# Patient Record
Sex: Female | Born: 1980
Health system: Southern US, Community
[De-identification: ages and names within clinical notes are randomized; demographics above are authoritative.]

## PROBLEM LIST (undated history)

## (undated) DIAGNOSIS — F32A Depression, unspecified: Secondary | ICD-10-CM

## (undated) DIAGNOSIS — I471 Supraventricular tachycardia, unspecified: Secondary | ICD-10-CM

## (undated) DIAGNOSIS — J189 Pneumonia, unspecified organism: Secondary | ICD-10-CM

## (undated) DIAGNOSIS — N83209 Unspecified ovarian cyst, unspecified side: Secondary | ICD-10-CM

## (undated) DIAGNOSIS — T83711A Erosion of implanted vaginal mesh and other prosthetic materials to surrounding organ or tissue, initial encounter: Secondary | ICD-10-CM

## (undated) DIAGNOSIS — E282 Polycystic ovarian syndrome: Secondary | ICD-10-CM

## (undated) DIAGNOSIS — Z87898 Personal history of other specified conditions: Secondary | ICD-10-CM

## (undated) DIAGNOSIS — E669 Obesity, unspecified: Secondary | ICD-10-CM

## (undated) DIAGNOSIS — F429 Obsessive-compulsive disorder, unspecified: Secondary | ICD-10-CM

## (undated) DIAGNOSIS — K589 Irritable bowel syndrome without diarrhea: Secondary | ICD-10-CM

## (undated) DIAGNOSIS — I10 Essential (primary) hypertension: Secondary | ICD-10-CM

## (undated) DIAGNOSIS — F319 Bipolar disorder, unspecified: Secondary | ICD-10-CM

## (undated) DIAGNOSIS — F329 Major depressive disorder, single episode, unspecified: Secondary | ICD-10-CM

## (undated) DIAGNOSIS — E785 Hyperlipidemia, unspecified: Secondary | ICD-10-CM

## (undated) HISTORY — DX: Personal history of other specified conditions: Z87.898

## (undated) HISTORY — PX: INCONTINENCE SURGERY: SHX676

## (undated) HISTORY — DX: Irritable bowel syndrome, unspecified: K58.9

## (undated) HISTORY — DX: Bipolar disorder, unspecified: F31.9

## (undated) HISTORY — DX: Supraventricular tachycardia: I47.1

## (undated) HISTORY — DX: Major depressive disorder, single episode, unspecified: F32.9

## (undated) HISTORY — DX: Essential (primary) hypertension: I10

## (undated) HISTORY — DX: Unspecified ovarian cyst, unspecified side: N83.209

## (undated) HISTORY — DX: Polycystic ovarian syndrome: E28.2

## (undated) HISTORY — DX: Pneumonia, unspecified organism: J18.9

## (undated) HISTORY — DX: Erosion of implanted vaginal mesh to surrounding organ or tissue, initial encounter: T83.711A

## (undated) HISTORY — DX: Obsessive-compulsive disorder, unspecified: F42.9

## (undated) HISTORY — DX: Supraventricular tachycardia, unspecified: I47.10

## (undated) HISTORY — PX: TONSILLECTOMY: SUR1361

## (undated) HISTORY — DX: Depression, unspecified: F32.A

## (undated) HISTORY — DX: Hyperlipidemia, unspecified: E78.5

## (undated) HISTORY — DX: Obesity, unspecified: E66.9

## (undated) HISTORY — PX: LAPAROSCOPY: SHX197

## (undated) HISTORY — PX: ADENOIDECTOMY: SUR15

## (undated) HISTORY — PX: ABDOMINAL HYSTERECTOMY: SHX81

---
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
## 2001-02-05 ENCOUNTER — Emergency Department (HOSPITAL_COMMUNITY): Admission: EM | Admit: 2001-02-05 | Discharge: 2001-02-05 | Payer: Self-pay | Admitting: Emergency Medicine

## 2001-02-05 ENCOUNTER — Encounter: Payer: Self-pay | Admitting: Emergency Medicine

## 2003-08-14 ENCOUNTER — Ambulatory Visit (HOSPITAL_COMMUNITY): Admission: RE | Admit: 2003-08-14 | Discharge: 2003-08-14 | Payer: Self-pay | Admitting: *Deleted

## 2003-08-18 ENCOUNTER — Inpatient Hospital Stay (HOSPITAL_COMMUNITY): Admission: AD | Admit: 2003-08-18 | Discharge: 2003-08-19 | Payer: Self-pay | Admitting: *Deleted

## 2003-08-23 ENCOUNTER — Inpatient Hospital Stay (HOSPITAL_COMMUNITY): Admission: AD | Admit: 2003-08-23 | Discharge: 2003-08-23 | Payer: Self-pay | Admitting: Obstetrics and Gynecology

## 2003-08-24 ENCOUNTER — Inpatient Hospital Stay (HOSPITAL_COMMUNITY): Admission: AD | Admit: 2003-08-24 | Discharge: 2003-08-24 | Payer: Self-pay | Admitting: Obstetrics & Gynecology

## 2003-08-31 ENCOUNTER — Inpatient Hospital Stay (HOSPITAL_COMMUNITY): Admission: AD | Admit: 2003-08-31 | Discharge: 2003-09-02 | Payer: Self-pay | Admitting: Obstetrics and Gynecology

## 2005-06-29 ENCOUNTER — Other Ambulatory Visit (HOSPITAL_COMMUNITY): Admission: RE | Admit: 2005-06-29 | Discharge: 2005-06-30 | Payer: Self-pay | Admitting: Psychiatry

## 2005-06-30 ENCOUNTER — Other Ambulatory Visit (HOSPITAL_COMMUNITY): Admission: RE | Admit: 2005-06-30 | Discharge: 2005-07-03 | Payer: Self-pay | Admitting: Psychiatry

## 2005-06-30 ENCOUNTER — Ambulatory Visit: Payer: Self-pay | Admitting: Psychiatry

## 2006-03-30 HISTORY — PX: OTHER SURGICAL HISTORY: SHX169

## 2007-12-30 ENCOUNTER — Emergency Department (HOSPITAL_BASED_OUTPATIENT_CLINIC_OR_DEPARTMENT_OTHER): Admission: EM | Admit: 2007-12-30 | Discharge: 2007-12-30 | Payer: Self-pay | Admitting: Emergency Medicine

## 2008-03-30 DIAGNOSIS — J189 Pneumonia, unspecified organism: Secondary | ICD-10-CM

## 2008-03-30 HISTORY — DX: Pneumonia, unspecified organism: J18.9

## 2009-03-30 DIAGNOSIS — N83209 Unspecified ovarian cyst, unspecified side: Secondary | ICD-10-CM

## 2009-03-30 HISTORY — DX: Unspecified ovarian cyst, unspecified side: N83.209

## 2010-08-15 NOTE — H&P (Signed)
Brenda Perry, CLINE                        ACCOUNT NO.:  [id number]   MEDICAL RECORD NO.:  [id number]                   PATIENT TYPE:   LOCATION:                                       FACILITY:   PHYSICIAN:   B. Earlene Plater, M.D.               DATE OF BIRTH:   DATE OF ADMISSION:  DATE OF DISCHARGE:                                HISTORY & PHYSICAL   DATE OF ADMISSION:  August 31, 2003   ADMISSION DIAGNOSES:  1. Thirty-eight-week intrauterine pregnancy.  2. Bipolar disorder with psychotic features.   She is admitted for induction of labor due to the fact that she is unable to  tolerate her psychiatric medications during the pregnancy and is in need of  restarting them.  She is having severe leg cramps and restless legs and  discontinued medication about 1 week ago due to this.  The patient is  willing to restart after pregnancy is ended.  She also has advanced cervical  dilatation with the cervix being dilated 3-4 cm and 70% effaced with group B  strep status.  For these reasons the patient is admitted for induction of  labor.  We did discuss a low likelihood of any significant respiratory  distress, given her proximity to 39 weeks.   PAST MEDICAL HISTORY:  1. Bipolar disorder with psychotic features as outlined above; history of     previous suicide attempt.  2. IBS.  3. Anemia.   FAMILY HISTORY:  Hypertension, diabetes.   PAST SURGICAL HISTORY:  Tonsils.   SOCIAL HISTORY:  The patient smoked prior to pregnancy but stopped when she  learned she was pregnant.  No alcohol, tobacco or other drugs.   MEDICATIONS:  Prenatal vitamins.   ALLERGIES:  LATEX.   PRENATAL LABORATORY DATA:  Group B strep is positive.  O positive.  Rubella  immune.  Hepatitis B, HIV all negative.   PHYSICAL EXAMINATION:  VITAL SIGNS:  Blood pressure 110/70, weight 198.  GENERAL:  Alert and oriented, no acute distress.  SKIN:  Warm and dry, no lesions.  HEART:  Regular rate and rhythm.  LUNGS:   Clear to auscultation.  ABDOMEN:  Fundal height is appropriate.  Fetal heart tones in the 130s.  PELVIC:  Cervix 3-4 cm dilated, 70% effaced, 0 station, and vertex.   ASSESSMENT:  1. Thirty-eight-week intrauterine pregnancy.  2. History of severe depression with bipolar disorder, psychotic features     and previous suicide attempt, not tolerating psychiatric medicines while     pregnancy.  3. Group B streptococcus positive.   PLAN:  1. Admission for induction of labor.  2. Penicillin prophylaxis for group B strep.  Gerri Spore B. Earlene Plater, M.D.    WBD/MEDQ  D:  08/30/2003  T:  08/30/2003  Job:  [id number]

## 2011-08-12 ENCOUNTER — Encounter: Payer: Self-pay | Admitting: Obstetrics & Gynecology

## 2011-08-12 ENCOUNTER — Other Ambulatory Visit (HOSPITAL_COMMUNITY)
Admission: RE | Admit: 2011-08-12 | Discharge: 2011-08-12 | Disposition: A | Discharge status: Home / Self Care | Payer: Medicare PPO | Source: Ambulatory Visit | Attending: Obstetrics & Gynecology | Admitting: Obstetrics & Gynecology

## 2011-08-12 ENCOUNTER — Ambulatory Visit (INDEPENDENT_AMBULATORY_CARE_PROVIDER_SITE_OTHER): Payer: Medicare PPO | Admitting: Obstetrics & Gynecology

## 2011-08-12 ENCOUNTER — Other Ambulatory Visit: Payer: Self-pay | Admitting: Obstetrics & Gynecology

## 2011-08-12 VITALS — BP 119/83 | HR 87 | Temp 98.5°F | Resp 17 | Ht 66.0 in | Wt 223.0 lb

## 2011-08-12 DIAGNOSIS — N898 Other specified noninflammatory disorders of vagina: Secondary | ICD-10-CM

## 2011-08-12 DIAGNOSIS — R87619 Unspecified abnormal cytological findings in specimens from cervix uteri: Secondary | ICD-10-CM | POA: Insufficient documentation

## 2011-08-12 DIAGNOSIS — Z124 Encounter for screening for malignant neoplasm of cervix: Secondary | ICD-10-CM | POA: Insufficient documentation

## 2011-08-12 DIAGNOSIS — IMO0002 Reserved for concepts with insufficient information to code with codable children: Secondary | ICD-10-CM | POA: Insufficient documentation

## 2011-08-12 DIAGNOSIS — Z0000 Encounter for general adult medical examination without abnormal findings: Secondary | ICD-10-CM

## 2011-08-12 DIAGNOSIS — R32 Unspecified urinary incontinence: Secondary | ICD-10-CM

## 2011-08-12 NOTE — Progress Notes (Signed)
  Subjective:     Brenda Perry is a 31 y.o. female here for a routine exam.  Current complaints: pain with sex, splinting during bowel movements, urinary incontinence.  Personal health questionnaire reviewed: yes.   Gynecologic History Patient's last menstrual period was 09/12/2006. Contraception: status post hysterectomy Last Pap: 2011. Results were: normal per pt Last mammogram: 5 months ago. Results were: abnml--getting follow up  Obstetric History OB History    Grav Para Term Preterm Abortions TAB SAB Ect Mult Living   2 2 2       2      # Outc Date GA Lbr Len/2nd Wgt Sex Del Anes PTL Lv   1 TRM      SVD      2 TRM      SVD          The following portions of the patient's history were reviewed and updated as appropriate: allergies, current medications, past family history, past medical history, past social history, past surgical history and problem list.  Review of Systems A comprehensive review of systems was negative except for: Gastrointestinal: positive for loose stools with metformin Genitourinary: positive for frequency, urinary incontinence and pain with intercourse, mesh "cut" partners penis, splinting with bowel movements, urinary urge incontince, feeling of dribbling, vaginal discharge and odor    Objective:    Vitals:  WNL General appearance: alert, cooperative and no distress Head: Normocephalic, without obvious abnormality, atraumatic Eyes: negative Throat: lips, mucosa, and tongue normal; teeth and gums normal Lungs: clear to auscultation bilaterally Breasts: normal appearance, no masses or tenderness, No nipple retraction or dimpling, No nipple discharge or bleeding Heart: regular rate and rhythm Abdomen: soft, non-tender; bowel sounds normal; no masses,  no organomegaly Pelvic: uterus surgically absent, no adnexal masses, pain with palpation of urethra, area under left side of symphysis is is thinner and painful, did not feel know or mesh, decreased  sphincter tone, stool in vault, nml vaginal discharge, descent of anterior vaginal wall with valsalva, mild rectocele near rectal sphincter. Extremities: no edema, redness or tenderness in the calves or thighs Skin: no lesions or rash Lymph nodes: Axillary adenopathy: none       Assessment:    Healthy female exam.  Urinary / pelvic prolapse issues   Plan:    Needs appt with urologys--MacDirmid F/U with primary care regarding mammogram Pap today (needs yearly until age 69) Wet prep and cultures

## 2011-08-13 LAB — GC/CHLAMYDIA PROBE AMP, GENITAL
Chlamydia, DNA Probe: NEGATIVE
GC Probe Amp, Genital: NEGATIVE

## 2011-08-17 ENCOUNTER — Other Ambulatory Visit: Payer: Self-pay | Admitting: Obstetrics & Gynecology

## 2011-08-17 ENCOUNTER — Telehealth: Payer: Self-pay | Admitting: *Deleted

## 2011-08-17 MED ORDER — METRONIDAZOLE 500 MG PO TABS: 500.0000 mg | ORAL_TABLET | Freq: Two times a day (BID) | ORAL | Status: AC

## 2011-08-17 NOTE — Telephone Encounter (Signed)
LM on cell phone that she does have BV and that a RX for Flagyl was sent to [facility].  Instructed pt to call if she had any questions.

## 2012-01-11 ENCOUNTER — Ambulatory Visit (HOSPITAL_COMMUNITY): Payer: Self-pay | Admitting: Psychiatry

## 2012-02-09 ENCOUNTER — Ambulatory Visit (HOSPITAL_COMMUNITY): Payer: Self-pay | Admitting: Psychiatry

## 2012-02-12 ENCOUNTER — Ambulatory Visit (HOSPITAL_COMMUNITY): Payer: Self-pay | Admitting: Psychiatry

## 2012-02-19 ENCOUNTER — Ambulatory Visit (HOSPITAL_COMMUNITY): Payer: Self-pay | Admitting: Psychiatry

## 2012-03-03 ENCOUNTER — Encounter (HOSPITAL_COMMUNITY): Payer: Self-pay | Admitting: Psychiatry

## 2012-03-03 ENCOUNTER — Ambulatory Visit (INDEPENDENT_AMBULATORY_CARE_PROVIDER_SITE_OTHER): Payer: 59 | Admitting: Psychiatry

## 2012-03-03 VITALS — BP 125/82 | HR 85 | Ht 65.25 in | Wt 224.0 lb

## 2012-03-03 DIAGNOSIS — F250 Schizoaffective disorder, bipolar type: Secondary | ICD-10-CM

## 2012-03-03 DIAGNOSIS — F259 Schizoaffective disorder, unspecified: Secondary | ICD-10-CM

## 2012-03-03 MED ORDER — ARIPIPRAZOLE 15 MG PO TABS: 7.5000 mg | ORAL_TABLET | Freq: Two times a day (BID) | ORAL | Status: DC
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
## 2012-03-08 ENCOUNTER — Telehealth (HOSPITAL_COMMUNITY): Payer: Self-pay

## 2012-03-08 ENCOUNTER — Encounter (HOSPITAL_COMMUNITY): Payer: Self-pay | Admitting: Psychiatry

## 2012-03-09 NOTE — Telephone Encounter (Signed)
Called patient, no answer. Called patient's pharmacy. The patient last filled Abilify on 01/27/2012. She did not fill prescription from this provider on 03/02/2012. She states she will be going back to Dr. Caralee Ates for care.   PLAN: As patient has not filled Abilify and according to pharmacy would not have had Abilify since 02/26/2012, assuming she was taking this on a regular basis. Will discontinue prescription and send letter regarding discontinuation of services.

## 2012-03-10 ENCOUNTER — Ambulatory Visit (HOSPITAL_COMMUNITY): Payer: Self-pay | Admitting: Psychiatry

## 2012-03-17 ENCOUNTER — Ambulatory Visit (HOSPITAL_COMMUNITY): Payer: Self-pay | Admitting: Psychiatry

## 2012-04-26 ENCOUNTER — Encounter: Payer: Self-pay | Admitting: Cardiology

## 2012-04-26 ENCOUNTER — Encounter: Payer: Self-pay | Admitting: *Deleted

## 2012-04-27 ENCOUNTER — Ambulatory Visit (INDEPENDENT_AMBULATORY_CARE_PROVIDER_SITE_OTHER): Payer: 59 | Admitting: Cardiology

## 2012-04-27 ENCOUNTER — Encounter: Payer: Self-pay | Admitting: Cardiology

## 2012-04-27 ENCOUNTER — Ambulatory Visit: Payer: Self-pay | Admitting: Cardiology

## 2012-04-27 VITALS — BP 120/70 | HR 81 | Wt 225.0 lb

## 2012-04-27 DIAGNOSIS — Z720 Tobacco use: Secondary | ICD-10-CM

## 2012-04-27 DIAGNOSIS — F17200 Nicotine dependence, unspecified, uncomplicated: Secondary | ICD-10-CM

## 2012-04-27 DIAGNOSIS — R002 Palpitations: Secondary | ICD-10-CM

## 2012-04-27 NOTE — Assessment & Plan Note (Signed)
Etiology unclear. She had an echocardiogram and monitor in the fall. I will ask for those records to be forwarded to [place] for review. We will make further recommendations once we have those results. Continue Toprol. Recent TSH and potassium normal.

## 2012-04-27 NOTE — Progress Notes (Signed)
HPI: 32 year old female for evaluation of palpitations. Note she has had a previous negative evaluation for pheochromocytoma including imaging. Thyroid functions in December 2013 normal. Potassium 4.9. Patient states she has had palpitations for approximately one year. She describes these as sudden in onset with her heart racing and then gradually slowing down. They last anywhere from 5-30 minutes.  She notices this both with exertion and at rest. She has some associated dizziness and occasional chest pain with these episodes. She has not had syncope. She also has dyspnea with the episodes. She denies exertional chest pain, dyspnea on exertion, orthopnea, PND. She was seen by a cardiologist in [place] in the fall. She had an echocardiogram that she was told was normal and a monitor that showed sinus tachycardia. I do not have those results available. Because of her palpitations we were asked to evaluate.  Current Outpatient Prescriptions  Medication Sig Dispense Refill  . ARIPiprazole (ABILIFY) 15 MG tablet Take 0.5 tablets (7.5 mg total) by mouth 2 (two) times daily.  30 tablet  1  . LORazepam (ATIVAN) 1 MG tablet Take 1 mg by mouth every 8 (eight) hours as needed.       . metFORMIN (GLUCOPHAGE-XR) 500 MG 24 hr tablet Take 500 mg by mouth 2 (two) times daily.       . metoprolol succinate (TOPROL-XL) 25 MG 24 hr tablet Take 25 mg by mouth 2 (two) times daily.         Allergies  Allergen Reactions  . Latex Rash    Past Medical History  Diagnosis Date  . IBS (irritable bowel syndrome)   . Bipolar 1 disorder   . OCD (obsessive compulsive disorder)   . Depression   . Pneumonia 2010  . Hyperlipidemia   . Hypertension   . PCOS (polycystic ovarian syndrome)   . Endometriosis 2011  . Ovarian cyst 2011  . H/O abnormal Pap smear     cryo    Past Surgical History  Procedure Date  . Laparoscopy 1996  2011  . Lavh 2008  . Vaginal/rectal repair 2008  . Tonsillectomy and adenoidectomy  1996  . Abdominal hysterectomy     History   Social History  . Marital Status: Married    Spouse Name: N/A    Number of Children: 3  . Years of Education: N/A   Occupational History  . Not on file.   Social History Main Topics  . Smoking status: Current Every Day Smoker -- 1.0 packs/day for 15 years    Types: Cigarettes  . Smokeless tobacco: Never Used  . Alcohol Use: Yes     Comment: jan 2013  . Drug Use: No     Comment: Past use 22.  Marland Kitchen Sexually Active: Yes -- Female partner(s)    Birth Control/ Protection: Other-see comments     Comment: hysterectomy   Other Topics Concern  . Not on file   Social History Narrative  . No narrative on file    Family History  Problem Relation Age of Onset  . Cancer Father     femure  . Hypertension Father   . Hyperlipidemia Father   . Diabetes Father   . Vasculitis Father     Cerbral Vasculitis  . Cancer Mother     uterine  . Hyperlipidemia Mother   . Fibromyalgia Mother   . Cancer Paternal Aunt     vaginal  . Endometriosis Cousin   . Lupus Cousin   . Heart disease Paternal Grandmother   .  Diabetes Paternal Grandmother   . Thyroid disease Paternal Grandmother   . Heart disease Paternal Aunt   . Heart disease Maternal Grandfather     A-Fib  . Hyperlipidemia Maternal Grandfather   . Hyperlipidemia Maternal Grandmother   . Coronary artery disease Paternal Grandfather   . Aneurysm Paternal Uncle     ROS: fatigue but no fevers or chills, productive cough, hemoptysis, dysphasia, odynophagia, melena, hematochezia, dysuria, hematuria, rash, seizure activity, orthopnea, PND, pedal edema, claudication. Remaining systems are negative.  Physical Exam:   Blood pressure 120/70, pulse 81, weight 225 lb (102.059 kg), last menstrual period 09/12/2006.  General:  Well developed/obese in NAD Skin warm/dry Patient not depressed No peripheral clubbing Back-normal HEENT-normal/normal eyelids Neck supple/normal carotid upstroke  bilaterally; no bruits; no JVD; no thyromegaly chest - CTA/ normal expansion CV - RRR/normal S1 and S2; no murmurs, rubs or gallops;  PMI nondisplaced Abdomen -NT/ND, no HSM, no mass, + bowel sounds, no bruit 2+ femoral pulses, no bruits Ext-no edema, chords, 2+ DP Neuro-grossly nonfocal  ECG sinus rhythm at a rate of 81. No ST changes.

## 2012-04-27 NOTE — Assessment & Plan Note (Signed)
Patient counseled on discontinuing. 

## 2012-04-27 NOTE — Patient Instructions (Addendum)
Your physician recommends that you schedule a follow-up appointment in: 8 WEEKS WITH DR Jens Som IN [place]

## 2012-04-28 ENCOUNTER — Telehealth: Payer: Self-pay | Admitting: Cardiology

## 2012-04-28 NOTE — Telephone Encounter (Signed)
ROI faxed to [facility] @ [phone] 04/28/12/KM

## 2012-06-22 ENCOUNTER — Encounter: Payer: Self-pay | Admitting: Cardiology

## 2012-06-22 NOTE — Progress Notes (Signed)
   HPI: 32 year old female for fu of palpitations. Note she has had a previous negative evaluation for pheochromocytoma including imaging. Thyroid functions in December 2013 normal. Potassium 4.9. Patient states she has had palpitations for approximately one year. She was seen by a cardiologist in [place] in the fall. She had an echocardiogram that she was told was normal and a monitor that showed sinus tachycardia. I do not have those results available. Since I last saw her,    Current Outpatient Prescriptions  Medication Sig Dispense Refill  . ARIPiprazole (ABILIFY) 15 MG tablet Take 0.5 tablets (7.5 mg total) by mouth 2 (two) times daily.  30 tablet  1  . LORazepam (ATIVAN) 1 MG tablet Take 1 mg by mouth every 8 (eight) hours as needed.       . metFORMIN (GLUCOPHAGE-XR) 500 MG 24 hr tablet Take 500 mg by mouth 2 (two) times daily.       . metoprolol succinate (TOPROL-XL) 25 MG 24 hr tablet Take 25 mg by mouth 2 (two) times daily.        No current facility-administered medications for this visit.     Past Medical History  Diagnosis Date  . IBS (irritable bowel syndrome)   . Bipolar 1 disorder   . OCD (obsessive compulsive disorder)   . Depression   . Pneumonia 2010  . Hyperlipidemia   . Hypertension   . PCOS (polycystic ovarian syndrome)   . Endometriosis 2011  . Ovarian cyst 2011  . H/O abnormal Pap smear     cryo    Past Surgical History  Procedure Laterality Date  . Laparoscopy  1996  2011  . Lavh  2008  . Vaginal/rectal repair  2008  . Tonsillectomy and adenoidectomy  1996  . Abdominal hysterectomy      History   Social History  . Marital Status: Married    Spouse Name: N/A    Number of Children: 3  . Years of Education: N/A   Occupational History  . Not on file.   Social History Main Topics  . Smoking status: Current Every Day Smoker -- 1.00 packs/day for 15 years    Types: Cigarettes  . Smokeless tobacco: Never Used  . Alcohol Use: Yes   Comment: jan 2013  . Drug Use: No     Comment: Past use 22.  Marland Kitchen Sexually Active: Yes -- Female partner(s)    Birth Control/ Protection: Other-see comments     Comment: hysterectomy   Other Topics Concern  . Not on file   Social History Narrative  . No narrative on file    ROS: no fevers or chills, productive cough, hemoptysis, dysphasia, odynophagia, melena, hematochezia, dysuria, hematuria, rash, seizure activity, orthopnea, PND, pedal edema, claudication. Remaining systems are negative.  Physical Exam: Well-developed well-nourished in no acute distress.  Skin is warm and dry.  HEENT is normal.  Neck is supple.  Chest is clear to auscultation with normal expansion.  Cardiovascular exam is regular rate and rhythm.  Abdominal exam nontender or distended. No masses palpated. Extremities show no edema. neuro grossly intact  ECG     This encounter was created in error - please disregard.

## 2012-07-04 ENCOUNTER — Encounter: Payer: Self-pay | Admitting: Cardiology

## 2012-07-06 ENCOUNTER — Telehealth: Payer: Self-pay | Admitting: Cardiology

## 2012-07-06 NOTE — Telephone Encounter (Signed)
New Problem:    Patient called in concerned because she is experiencing rapid heart rate, SOB and extreme fatigue with minimal exertion.  Please call back.

## 2012-07-06 NOTE — Telephone Encounter (Signed)
Pt C/O of heart rasing up even when sitting talking.heart beats goes up to  110 to 130 beats per minute, pt  has SOB, fatigue with it. Pt would like to know if she needs to be seen soon. An appointment was made for pt in this office tomorrow 07/07/12 at 9:00 AM, pt aware.

## 2012-07-07 ENCOUNTER — Encounter: Payer: Self-pay | Admitting: Cardiology

## 2012-07-07 ENCOUNTER — Ambulatory Visit (INDEPENDENT_AMBULATORY_CARE_PROVIDER_SITE_OTHER): Payer: 59 | Admitting: Cardiology

## 2012-07-07 VITALS — BP 140/90 | HR 86 | Ht 66.0 in | Wt 233.8 lb

## 2012-07-07 DIAGNOSIS — R079 Chest pain, unspecified: Secondary | ICD-10-CM

## 2012-07-07 DIAGNOSIS — R232 Flushing: Secondary | ICD-10-CM | POA: Insufficient documentation

## 2012-07-07 DIAGNOSIS — I10 Essential (primary) hypertension: Secondary | ICD-10-CM

## 2012-07-07 DIAGNOSIS — R002 Palpitations: Secondary | ICD-10-CM

## 2012-07-07 MED ORDER — METOPROLOL SUCCINATE ER 25 MG PO TB24: ORAL_TABLET | ORAL | Status: DC

## 2012-07-07 NOTE — Assessment & Plan Note (Signed)
Schedule stress-echocardiogram.

## 2012-07-07 NOTE — Progress Notes (Signed)
HPI: 32 year old female I initially saw in Jan 2014 for evaluation of palpitations. Note she has had a previous negative evaluation for pheochromocytoma including imaging. Thyroid functions in December 2013 normal. Potassium 4.9. She was seen by a cardiologist in [place] in the fall. She had an echocardiogram that she was told was normal and a monitor that showed sinus tachycardia. I do not have those results available. Since I last saw her, she describes fatigue which is unchanged. She has dyspnea on exertion. She describes chest pain both at rest and with exertion. She describes heart pounding.   Current Outpatient Prescriptions  Medication Sig Dispense Refill  . ARIPiprazole (ABILIFY) 5 MG tablet Take 5 mg by mouth 2 (two) times daily.      Marland Kitchen LORazepam (ATIVAN) 1 MG tablet Take 1 mg by mouth every 8 (eight) hours as needed.       . metFORMIN (GLUCOPHAGE-XR) 500 MG 24 hr tablet Take 500 mg by mouth 2 (two) times daily.       . metoprolol succinate (TOPROL-XL) 25 MG 24 hr tablet Take 25 mg by mouth 2 (two) times daily.        No current facility-administered medications for this visit.     Past Medical History  Diagnosis Date  . Bipolar disorder   . Polycystic ovary syndrome   . HTN (hypertension)   . Obesity   . Erosion of vaginal mesh   . PSVT (paroxysmal supraventricular tachycardia)   . IBS (irritable bowel syndrome)   . Bipolar 1 disorder   . OCD (obsessive compulsive disorder)   . Depression   . Pneumonia 2010  . Hyperlipidemia   . Hypertension   . PCOS (polycystic ovarian syndrome)   . Endometriosis 2011  . Ovarian cyst 2011  . H/O abnormal Pap smear     cryo    Past Surgical History  Procedure Laterality Date  . Adenoidectomy    . Tonsillectomy      `  . Incontinence surgery    . Breast tissure bx-fibrosis    . Laparoscopy  1996  2011  . Lavh  2008  . Vaginal/rectal repair  2008  . Tonsillectomy and adenoidectomy  1996  . Abdominal hysterectomy       History   Social History  . Marital Status: Married    Spouse Name: N/A    Number of Children: 3  . Years of Education: N/A   Occupational History  . Not on file.   Social History Main Topics  . Smoking status: Current Every Day Smoker -- 1.00 packs/day for 15 years    Types: Cigarettes  . Smokeless tobacco: Never Used  . Alcohol Use: Yes     Comment: jan 2013  . Drug Use: No     Comment: Past use 22.  Marland Kitchen Sexually Active: Yes -- Female partner(s)    Birth Control/ Protection: Other-see comments     Comment: hysterectomy   Other Topics Concern  . Not on file   Social History Narrative   ** Merged History Encounter **        ROS: flushing but no fevers or chills, productive cough, hemoptysis, dysphasia, odynophagia, melena, hematochezia, dysuria, hematuria, rash, seizure activity, orthopnea, PND, pedal edema, claudication. Remaining systems are negative.  Physical Exam: Well-developed obese in no acute distress.  Skin is warm and dry.  HEENT is normal.  Neck is supple.  Chest is clear to auscultation with normal expansion.  Cardiovascular exam is regular rate and rhythm.  Abdominal exam nontender or distended. No masses palpated. Extremities show no edema. neuro grossly intact  ECG sinus rhythm at a rate of 86. No ST changes.

## 2012-07-07 NOTE — Patient Instructions (Addendum)
Your physician recommends that you schedule a follow-up appointment in: 6-8 WEEKS WITH DR Jens Som IN [facility]  Your physician has requested that you have a stress echocardiogram. For further information please visit [email or web address] Please follow instruction sheet as given.   24 HOUR URINE  Your physician has recommended that you wear an event monitor. Event monitors are medical devices that record the heart's electrical activity. Doctors most often [place] these monitors to diagnose arrhythmias. Arrhythmias are problems with the speed or rhythm of the heartbeat. The monitor is a small, portable device. You can wear one while you do your normal daily activities. This is usually used to diagnose what is causing palpitations/syncope (passing out).   INCREASE METOPROLOL TO 25 MG IN THE MORNING AND 50 MG IN THE EVENING

## 2012-07-07 NOTE — Assessment & Plan Note (Signed)
We'll arrange urine for catecholamines and metanephrines. Check 5 hydroxy indoleacetic acid. I think carcinoid is unlikely but should be excluded.

## 2012-07-07 NOTE — Assessment & Plan Note (Signed)
Plan repeatCardioNet. Increase Toprol to 25 mg in the morning and 50 mg in the evening.

## 2012-07-08 ENCOUNTER — Telehealth: Payer: Self-pay | Admitting: *Deleted

## 2012-07-08 ENCOUNTER — Other Ambulatory Visit (HOSPITAL_COMMUNITY): Payer: Self-pay | Admitting: Cardiology

## 2012-07-08 DIAGNOSIS — R072 Precordial pain: Secondary | ICD-10-CM

## 2012-07-08 NOTE — Telephone Encounter (Signed)
Left message for patient to call me back concerning her appointments.

## 2012-07-12 ENCOUNTER — Other Ambulatory Visit (INDEPENDENT_AMBULATORY_CARE_PROVIDER_SITE_OTHER): Payer: 59

## 2012-07-12 ENCOUNTER — Other Ambulatory Visit: Payer: Self-pay | Admitting: *Deleted

## 2012-07-12 ENCOUNTER — Other Ambulatory Visit (HOSPITAL_COMMUNITY): Payer: Self-pay

## 2012-07-12 ENCOUNTER — Telehealth: Payer: Self-pay | Admitting: *Deleted

## 2012-07-12 ENCOUNTER — Encounter (INDEPENDENT_AMBULATORY_CARE_PROVIDER_SITE_OTHER): Payer: 59

## 2012-07-12 ENCOUNTER — Ambulatory Visit (HOSPITAL_COMMUNITY): Payer: 59 | Attending: Cardiology

## 2012-07-12 DIAGNOSIS — R079 Chest pain, unspecified: Secondary | ICD-10-CM | POA: Insufficient documentation

## 2012-07-12 DIAGNOSIS — R0989 Other specified symptoms and signs involving the circulatory and respiratory systems: Secondary | ICD-10-CM

## 2012-07-12 DIAGNOSIS — R072 Precordial pain: Secondary | ICD-10-CM

## 2012-07-12 DIAGNOSIS — R002 Palpitations: Secondary | ICD-10-CM

## 2012-07-12 DIAGNOSIS — I10 Essential (primary) hypertension: Secondary | ICD-10-CM

## 2012-07-12 NOTE — Progress Notes (Signed)
Echocardiogram performed.  

## 2012-07-12 NOTE — Telephone Encounter (Signed)
21 day event monitor placed on Pt 07/12/12 TK

## 2012-07-14 ENCOUNTER — Telehealth: Payer: Self-pay | Admitting: Cardiology

## 2012-07-14 DIAGNOSIS — R079 Chest pain, unspecified: Secondary | ICD-10-CM

## 2012-07-14 DIAGNOSIS — R002 Palpitations: Secondary | ICD-10-CM

## 2012-07-14 DIAGNOSIS — I10 Essential (primary) hypertension: Secondary | ICD-10-CM

## 2012-07-14 MED ORDER — METOPROLOL SUCCINATE ER 25 MG PO TB24: ORAL_TABLET | ORAL | Status: DC

## 2012-07-14 NOTE — Telephone Encounter (Signed)
Spoke with pt, gxt rescheduled prior to the 30th for the pt since she is loosing her insurance. Refill sent to pharm. Will check on the monitor about billing and call the pt back.

## 2012-07-14 NOTE — Telephone Encounter (Signed)
New problem   Pt won't have any insurance after 4/30 and want to speak to someone about her medication and stress test. Please call pt.

## 2012-07-14 NOTE — Telephone Encounter (Signed)
Spoke with pt, explained she will be billed for the event monitor once it is read and signed. Encouraged the pt to go ahead and mail it back in prior to the 30th. Pt voiced understanding.

## 2012-07-18 ENCOUNTER — Telehealth: Payer: Self-pay | Admitting: Cardiology

## 2012-07-18 NOTE — Telephone Encounter (Signed)
Called patient back. She was looking for results of her 24 hour urine test. Advised test still in process and will call back when results are available.

## 2012-07-18 NOTE — Telephone Encounter (Signed)
New problem:  Test results.  

## 2012-07-19 LAB — CATECHOLAMINES, FRACTIONATED, URINE, 24 HOUR
Creatinine, Urine mg/day-CATEUR: 0.79 g/(24.h) (ref 0.63–2.50)
Norepinephrine, 24 hr Ur: 19 mcg/24 h (ref 15–100)

## 2012-07-19 LAB — 5 HIAA, QUANTITATIVE, URINE, 24 HOUR: 5-HIAA, 24 Hr Urine: 2.2 mg/24 h (ref <=6.0)

## 2012-07-19 LAB — METANEPHRINES, URINE, 24 HOUR: Normetanephrine, 24H Ur: 151 mcg/24 h (ref 35–482)

## 2012-07-20 NOTE — Telephone Encounter (Signed)
Left message of 24 hours urine test results for pt

## 2012-07-22 ENCOUNTER — Ambulatory Visit (INDEPENDENT_AMBULATORY_CARE_PROVIDER_SITE_OTHER): Payer: 59 | Admitting: Nurse Practitioner

## 2012-07-22 ENCOUNTER — Encounter: Payer: Self-pay | Admitting: Nurse Practitioner

## 2012-07-22 VITALS — BP 142/92 | HR 127

## 2012-07-22 DIAGNOSIS — R079 Chest pain, unspecified: Secondary | ICD-10-CM

## 2012-07-22 NOTE — Procedures (Signed)
Exercise Treadmill Test  Pre-Exercise Testing Evaluation Rhythm: normal sinus  Rate: 113     Test  Exercise Tolerance Test Ordering MD: Olga Millers, MD  Interpreting MD: Norma Fredrickson NP  Unique Test No: 1   Treadmill:  1  Indication for ETT: chest pain - rule out ischemia  Contraindication to ETT: No   Stress Modality: exercise - treadmill  Cardiac Imaging Performed: non   Protocol: standard Bruce - maximal  Max BP:  199/77  Max MPHR (bpm):  173 85% MPR (bpm):  160  MPHR obtained (bpm):  188 % MPHR obtained:  92%  Reached 85% MPHR (min:sec):  3:10 Total Exercise Time (min-sec):  5:00  Workload in METS:  6.6 Borg Scale: 15  Reason ETT Terminated:  patient's desire to stop    ST Segment Analysis At Rest: normal ST segments - no evidence of significant ST depression With Exercise: no evidence of significant ST depression  Other Information Arrhythmia:  No Angina during ETT:  present (1) Quality of ETT:  diagnostic  ETT Interpretation:  normal - no evidence of ischemia by ST analysis  Comments: Patient presents today for routine GXT. Has had tachycardia, fatigue and chest discomfort. Symptoms have improved (but not resolved) with recent increase in her metoprolol. Also with skin changes - she looks as if she is sunburned and has had no sun exposure. Negative echo. Cardionet results pending.  Today, she exercised on the standard Bruce protocol for 5 minutes. She has poor and reduced exercise tolerance. Hypertensive BP response. Clinically with very mild chest discomfort at the peak of exercise when her heart rate was 173 and resolved quickly in recovery. Starting heart rate was 127 and NO medicines were held for the study. EKG was negative for ischemia. No arrhythmia noted.   Recommendations: Regular exercise and weight loss.  Will review with Dr. Jens Som. Resting tachycardia persists despite dose of beta blocker.  Unfortunately, the patient is losing her insurance at the end  of this month and will not be able to come back for follow up visit.  Patient is agreeable to this plan and will call if any problems develop in the interim.   Rosalio Macadamia, RN, ANP-C [facility] [address] [place], [place]  [address]

## 2012-07-25 ENCOUNTER — Telehealth: Payer: Self-pay | Admitting: *Deleted

## 2012-07-25 NOTE — Telephone Encounter (Signed)
S/w pt regarding losing ins. Pt will keep appt with Dr. Jens Som in May and I will send pt a financial paper to fill out. Obie also s/w pt and stated when pt comes in May for appt.  will not have to make a payment will send bill to house. Pt was appreciative

## 2012-07-27 ENCOUNTER — Telehealth: Payer: Self-pay | Admitting: *Deleted

## 2012-07-27 NOTE — Telephone Encounter (Signed)
Monitor reviewed by dr Jens Som shows sinus to sinus tach,  pt aware of results

## 2012-08-01 ENCOUNTER — Encounter: Payer: Self-pay | Admitting: Physician Assistant

## 2012-08-03 ENCOUNTER — Ambulatory Visit: Payer: Self-pay | Admitting: Cardiology

## 2012-08-17 ENCOUNTER — Ambulatory Visit (INDEPENDENT_AMBULATORY_CARE_PROVIDER_SITE_OTHER): Payer: Self-pay | Admitting: Cardiology

## 2012-08-17 ENCOUNTER — Encounter: Payer: Self-pay | Admitting: Cardiology

## 2012-08-17 VITALS — BP 138/80 | HR 84 | Ht 66.0 in | Wt 232.0 lb

## 2012-08-17 DIAGNOSIS — R002 Palpitations: Secondary | ICD-10-CM

## 2012-08-17 DIAGNOSIS — R079 Chest pain, unspecified: Secondary | ICD-10-CM

## 2012-08-17 NOTE — Assessment & Plan Note (Signed)
Exercise treadmill showed no ST changes. Echocardiogram shows normal LV function.

## 2012-08-17 NOTE — Assessment & Plan Note (Signed)
Monitor reveals sinus to sinus tachycardia and patient states she did have symptoms with a monitor in place. Continue beta blocker. Previous TSH normal. Note she has also complained about Flushing. Laboratories to screen for pheochromocytoma and carcinoid unremarkable.

## 2012-08-17 NOTE — Progress Notes (Signed)
HPI: Pleasant female for fu of palpitations. Note she has had a previous negative evaluation for pheochromocytoma including imaging. Thyroid functions in December 2013 normal. Potassium 4.9. Previous sedimentation rate normal. She was seen by a cardiologist in [place] in the fall. She had an echocardiogram and she was told was normal and a monitor that showed sinus tachycardia. I do not have those results available. Echocardiogram in April of 2014 showed normal LV function and no significant valvular disease. Exercise treadmill in April of 2014 showed poor exercise tolerance and a hypertensive blood pressure response. There was mild chest discomfort but no ischemia or arrhythmias noted. CardioNet in April of 2014 showed sinus rhythm to sinus tachycardia. 5 hydroxy indoleacetic acid was normal in April of 2014. Urine metanephrines and normetanephrines were normal as well. Since she was last seen, she feels somewhat better but still has persistent symptoms. She did have palpitations with a monitor in place. She has dyspnea on exertion. No orthopnea, PND or pedal edema. No chest pain. She continues to describe flushing at times.   Current Outpatient Prescriptions  Medication Sig Dispense Refill  . ARIPiprazole (ABILIFY) 5 MG tablet Take 5 mg by mouth 2 (two) times daily.      Marland Kitchen LORazepam (ATIVAN) 1 MG tablet Take 1 mg by mouth every 8 (eight) hours as needed.       . metFORMIN (GLUCOPHAGE-XR) 500 MG 24 hr tablet Take 500 mg by mouth 2 (two) times daily.       . metoprolol succinate (TOPROL-XL) 25 MG 24 hr tablet Take 25 mg by mouth 2 (two) times daily.       No current facility-administered medications for this visit.     Past Medical History  Diagnosis Date  . Bipolar disorder   . Polycystic ovary syndrome   . HTN (hypertension)   . Obesity   . Erosion of vaginal mesh   . PSVT (paroxysmal supraventricular tachycardia)   . IBS (irritable bowel syndrome)   . Bipolar 1 disorder   . OCD  (obsessive compulsive disorder)   . Depression   . Pneumonia 2010  . Hyperlipidemia   . Hypertension   . PCOS (polycystic ovarian syndrome)   . Endometriosis 2011  . Ovarian cyst 2011  . H/O abnormal Pap smear     cryo    Past Surgical History  Procedure Laterality Date  . Adenoidectomy    . Tonsillectomy      `  . Incontinence surgery    . Breast tissure bx-fibrosis    . Laparoscopy  1996  2011  . Lavh  2008  . Vaginal/rectal repair  2008  . Tonsillectomy and adenoidectomy  1996  . Abdominal hysterectomy      History   Social History  . Marital Status: Married    Spouse Name: N/A    Number of Children: 3  . Years of Education: N/A   Occupational History  . Not on file.   Social History Main Topics  . Smoking status: Current Every Day Smoker -- 1.00 packs/day for 15 years    Types: Cigarettes  . Smokeless tobacco: Never Used  . Alcohol Use: Yes     Comment: jan 2013  . Drug Use: No     Comment: Past use 22.  Marland Kitchen Sexually Active: Yes -- Female partner(s)    Birth Control/ Protection: Other-see comments     Comment: hysterectomy   Other Topics Concern  . Not on file   Social History Narrative   **  Merged History Encounter **        ROS: no fevers or chills, productive cough, hemoptysis, dysphasia, odynophagia, melena, hematochezia, dysuria, hematuria, rash, seizure activity, orthopnea, PND, pedal edema, claudication. Remaining systems are negative.  Physical Exam: Well-developed well-nourished in no acute distress.  Skin is warm and dry.  HEENT is normal.  Neck is supple.  Chest is clear to auscultation with normal expansion.  Cardiovascular exam is regular rate and rhythm.  Abdominal exam nontender or distended. No masses palpated. Extremities show no edema. neuro grossly intact

## 2014-01-29 ENCOUNTER — Encounter: Payer: Self-pay | Admitting: Cardiology

## 2018-04-22 ENCOUNTER — Emergency Department (INDEPENDENT_AMBULATORY_CARE_PROVIDER_SITE_OTHER)
Admission: EM | Admit: 2018-04-22 | Discharge: 2018-04-22 | Disposition: A | Discharge status: Home / Self Care | Payer: Medicaid Other | Source: Home / Self Care

## 2018-04-22 ENCOUNTER — Other Ambulatory Visit: Payer: Self-pay

## 2018-04-22 ENCOUNTER — Encounter: Payer: Self-pay | Admitting: Family Medicine

## 2018-04-22 DIAGNOSIS — M314 Aortic arch syndrome [Takayasu]: Secondary | ICD-10-CM

## 2018-04-22 NOTE — ED Provider Notes (Signed)
[facility]    CSN: [id number] Arrival date & time: 04/22/18  1414     History   Chief Complaint Chief Complaint  Patient presents with  . Skin Discoloration    Purple cheeks and knuckles    HPI Reginold Agentatricia Y Trew is a 38 y.o. female.   This is a 38 year old woman [facility].  Patient [place] recently moved [place].  Has had a long history of psychological problems and is a heavy smoker.  She has been smoking for 26 years.  Patient has been seeing several different doctors about her cold extremities.  She has had multiple tests for lupus, has been told she has Raynaud's syndrome, has been told she probably has lupus or some other rheumatological disease.  The blue doughy texture that she notes on her hands and feet is also present on her lips and cheeks.     Past Medical History:  Diagnosis Date  . Bipolar 1 disorder (HCC)   . Bipolar disorder (HCC)   . Depression   . Endometriosis 2011  . Erosion of vaginal mesh (HCC)   . H/O abnormal Pap smear    cryo  . HTN (hypertension)   . Hyperlipidemia   . Hypertension   . IBS (irritable bowel syndrome)   . Obesity   . OCD (obsessive compulsive disorder)   . Ovarian cyst 2011  . PCOS (polycystic ovarian syndrome)   . Pneumonia 2010  . Polycystic ovary syndrome   . PSVT (paroxysmal supraventricular tachycardia) [facility](HCC)     Patient Active Problem List   Diagnosis Date Noted  . Chest pain 07/07/2012  . Flushing 07/07/2012  . Palpitations 04/27/2012  . Tobacco abuse 04/27/2012  . Schizoaffective disorder, bipolar type (HCC) 03/03/2012  . Abnormal Pap smear     Past Surgical History:  Procedure Laterality Date  . ABDOMINAL HYSTERECTOMY    . ADENOIDECTOMY    . breast tissure bx-fibrosis    . INCONTINENCE SURGERY    . LAPAROSCOPY  1996  2011  . lavh  2008  . TONSILLECTOMY     `  . TONSILLECTOMY AND ADENOIDECTOMY  1996  . vaginal/rectal  repair  2008    OB History    Gravida  2   Para  2   Term  2   Preterm      AB      Living  2     SAB      TAB      Ectopic      Multiple      Live Births               Home Medications    Prior to Admission medications   Medication Sig Start Date End Date Taking? Authorizing Provider  acetaminophen (TYLENOL) 500 MG tablet Take 1,000 mg by mouth every 8 (eight) hours as needed.   Yes [provider]  albuterol (PROVENTIL HFA;VENTOLIN HFA) 108 (90 Base) MCG/ACT inhaler Inhale into the lungs every 6 (six) hours as needed for wheezing or shortness of breath.   Yes [provider]  ALPRAZolam Prudy Feeler(XANAX) 1 MG tablet Take 1 mg by mouth at bedtime as needed for anxiety.   Yes [provider]  amphetamine-dextroamphetamine (ADDERALL XR) 30 MG 24 hr capsule Take 30 mg by mouth daily.   Yes [provider]  aspirin EC 81 MG tablet Take 81 mg by mouth daily.   Yes [provider]  Biotin (BIOTIN MAXIMUM STRENGTH) 10 MG TABS Take by mouth.   Yes [provider]  furosemide (LASIX) 20 MG tablet Take 20 mg by mouth.   Yes [provider]  methocarbamol (ROBAXIN) 750 MG tablet Take 750 mg by mouth every 6 (six) hours as needed for muscle spasms.   Yes [provider]  ziprasidone (GEODON) 40 MG capsule Take 40 mg by mouth every evening.   Yes [provider]  metFORMIN (GLUCOPHAGE) 1000 MG tablet Take 1,000 mg by mouth 2 (two) times daily.    [provider]  nebivolol (BYSTOLIC) 10 MG tablet Take by mouth.    [provider]  pantoprazole (PROTONIX) 40 MG tablet Take 40 mg by mouth every morning.    [provider]    Family History Family History  Problem Relation Age of Onset  . Diabetes Other   . Hypertension Other   . Cancer Other   . Vasculitis Other   . Fibromyalgia Other   . Osteoporosis Other   . Heart disease Other   . Cancer Father        femure  .  Hypertension Father   . Hyperlipidemia Father   . Diabetes Father   . Vasculitis Father        Cerbral Vasculitis  . Cancer Mother        uterine  . Hyperlipidemia Mother   . Fibromyalgia Mother   . Cancer Paternal Aunt        vaginal  . Endometriosis Cousin   . Lupus Cousin   . Heart disease Paternal Grandmother   . Diabetes Paternal Grandmother   . Thyroid disease Paternal Grandmother   . Heart disease Paternal Aunt   . Heart disease Maternal Grandfather        A-Fib  . Hyperlipidemia Maternal Grandfather   . Hyperlipidemia Maternal Grandmother   . Coronary artery disease Paternal Grandfather   . Aneurysm Paternal Uncle     Social History Social History   Tobacco Use  . Smoking status: Current Every Day Smoker    Packs/day: 1.00    Years: 15.00    Pack years: 15.00    Types: Cigarettes  . Smokeless tobacco: Never Used  Substance Use Topics  . Alcohol use: Yes    Comment: jan 2013  . Drug use: No    Comment: Past use 22.     Allergies   Ativan [lorazepam]; Cymbalta [duloxetine hcl]; Depakote [divalproex sodium]; Effexor [venlafaxine hcl]; Geodon [ziprasidone]; Lamictal [lamotrigine]; Latex; Lithium; Neurontin [gabapentin]; Paxil [paroxetine hcl]; Prozac [fluoxetine]; Risperdal [risperidone]; Seroquel [quetiapine fumarate]; Xanax [alprazolam]; Zoloft [sertraline hcl]; and Latex   Review of Systems Review of Systems   Physical Exam Triage Vital Signs ED Triage Vitals  Enc Vitals Group     BP      Pulse      Resp      Temp      Temp src      SpO2      Weight      Height      Head Circumference      Peak Flow      Pain Score      Pain Loc      Pain Edu?      Excl. in GC?    No data found.  Updated Vital Signs BP 139/80 (BP Location: Right Arm)   Pulse 70   Temp 98 F (36.7 C) (Oral)   Ht 5' 4.5" (1.638  m)   Wt 63.5 kg   LMP 09/12/2006   SpO2 100%   BMI 23.66 kg/m    Physical Exam Vitals signs and nursing note reviewed.    Constitutional:      Appearance: Normal appearance. She is normal weight.  HENT:     Head: Normocephalic.  Cardiovascular:     Rate and Rhythm: Normal rate and regular rhythm.     Comments: Barely palpable radial pulses, no palpable dorsalis pedis or posterior tibial pulse. Pulmonary:     Effort: Pulmonary effort is normal.     Breath sounds: Normal breath sounds.  Musculoskeletal: Normal range of motion.     Comments: Patient has a few subcutaneous 5 mm nodules on her chest which are tender.  Skin:    Capillary Refill: Capillary refill takes more than 3 seconds.     Comments: Patient forearms show libido reticularis Extremities are cool, violaceous, doughy, slow capillary refill.  Neurological:     General: No focal deficit present.     Mental Status: She is alert and oriented to person, place, and time.  Psychiatric:        Mood and Affect: Mood normal.        Behavior: Behavior normal.            UC Treatments / Results  Labs (all labs ordered are listed, but only abnormal results are displayed) Labs Reviewed - No data to display  EKG None  Radiology No results found.  Procedures Procedures (including critical care time)  Medications Ordered in UC Medications - No data to display  Initial Impression / Assessment and Plan / UC Course  I have reviewed the triage vital signs and the nursing notes.  Pertinent labs & imaging results that were available during my care of the patient were reviewed by me and considered in my medical decision making (see chart for details).    Final Clinical Impressions(s) / UC Diagnoses   Final diagnoses:  Takayasu's disease [facility](HCC)     Discharge Instructions     Rich BraveWilliam Marshall, PhD psychologist  (hypnosis)  Your condition is called pulses disease.  He comes from smoking.  The arteries going to spasm and reduced blood flow to your extremities causing mild swelling with doughy texture, purple color, and coldness.  The  only known cure for this is smoking cessation.    ED Prescriptions    None     Controlled Substance Prescriptions [place] Controlled Substance Registry consulted? Not Applicable   Elvina SidleLauenstein, Aniko Finnigan, MD 04/22/18 1501

## 2018-04-22 NOTE — ED Triage Notes (Signed)
Pt c/o purple discoloration and coldness to cheeks and knuckles/toes x 2 weeks. No pain associated with it. Saw rheumatologist and had several tests done but no definitive dx.

## 2018-04-22 NOTE — Discharge Instructions (Addendum)
Brenda Brave, PhD psychologist  (hypnosis)  Your condition is called pulses disease.  He comes from smoking.  The arteries going to spasm and reduced blood flow to your extremities causing mild swelling with doughy texture, purple color, and coldness.  The only known cure for this is smoking cessation.

## 2018-05-09 ENCOUNTER — Ambulatory Visit: Payer: Self-pay | Admitting: Physician Assistant

## 2018-10-25 DIAGNOSIS — F411 Generalized anxiety disorder: Secondary | ICD-10-CM | POA: Diagnosis not present

## 2018-10-25 DIAGNOSIS — F900 Attention-deficit hyperactivity disorder, predominantly inattentive type: Secondary | ICD-10-CM | POA: Diagnosis not present

## 2018-10-25 DIAGNOSIS — F4310 Post-traumatic stress disorder, unspecified: Secondary | ICD-10-CM | POA: Diagnosis not present

## 2018-10-25 DIAGNOSIS — F250 Schizoaffective disorder, bipolar type: Secondary | ICD-10-CM | POA: Diagnosis not present

## 2018-11-22 DIAGNOSIS — F603 Borderline personality disorder: Secondary | ICD-10-CM | POA: Diagnosis not present

## 2018-11-22 DIAGNOSIS — F4310 Post-traumatic stress disorder, unspecified: Secondary | ICD-10-CM | POA: Diagnosis not present

## 2018-11-22 DIAGNOSIS — F411 Generalized anxiety disorder: Secondary | ICD-10-CM | POA: Diagnosis not present

## 2018-11-22 DIAGNOSIS — F250 Schizoaffective disorder, bipolar type: Secondary | ICD-10-CM | POA: Diagnosis not present

## 2018-11-25 DIAGNOSIS — F250 Schizoaffective disorder, bipolar type: Secondary | ICD-10-CM | POA: Diagnosis not present

## 2018-11-25 DIAGNOSIS — F902 Attention-deficit hyperactivity disorder, combined type: Secondary | ICD-10-CM | POA: Diagnosis not present

## 2018-11-25 DIAGNOSIS — F310 Bipolar disorder, current episode hypomanic: Secondary | ICD-10-CM | POA: Diagnosis not present

## 2018-11-25 DIAGNOSIS — F411 Generalized anxiety disorder: Secondary | ICD-10-CM | POA: Diagnosis not present

## 2018-11-25 DIAGNOSIS — F840 Autistic disorder: Secondary | ICD-10-CM | POA: Diagnosis not present

## 2018-12-01 DIAGNOSIS — F902 Attention-deficit hyperactivity disorder, combined type: Secondary | ICD-10-CM | POA: Diagnosis not present

## 2018-12-01 DIAGNOSIS — F310 Bipolar disorder, current episode hypomanic: Secondary | ICD-10-CM | POA: Diagnosis not present

## 2018-12-01 DIAGNOSIS — F411 Generalized anxiety disorder: Secondary | ICD-10-CM | POA: Diagnosis not present

## 2018-12-01 DIAGNOSIS — F840 Autistic disorder: Secondary | ICD-10-CM | POA: Diagnosis not present

## 2018-12-14 DIAGNOSIS — K2970 Gastritis, unspecified, without bleeding: Secondary | ICD-10-CM | POA: Diagnosis not present

## 2018-12-14 DIAGNOSIS — E538 Deficiency of other specified B group vitamins: Secondary | ICD-10-CM | POA: Diagnosis not present

## 2018-12-14 DIAGNOSIS — E782 Mixed hyperlipidemia: Secondary | ICD-10-CM | POA: Diagnosis not present

## 2018-12-15 DIAGNOSIS — F250 Schizoaffective disorder, bipolar type: Secondary | ICD-10-CM | POA: Diagnosis not present

## 2018-12-15 DIAGNOSIS — F4481 Dissociative identity disorder: Secondary | ICD-10-CM | POA: Diagnosis not present

## 2018-12-15 DIAGNOSIS — F331 Major depressive disorder, recurrent, moderate: Secondary | ICD-10-CM | POA: Diagnosis not present

## 2018-12-15 DIAGNOSIS — F840 Autistic disorder: Secondary | ICD-10-CM | POA: Diagnosis not present

## 2018-12-19 DIAGNOSIS — F902 Attention-deficit hyperactivity disorder, combined type: Secondary | ICD-10-CM | POA: Diagnosis not present

## 2018-12-19 DIAGNOSIS — F840 Autistic disorder: Secondary | ICD-10-CM | POA: Diagnosis not present

## 2018-12-19 DIAGNOSIS — F310 Bipolar disorder, current episode hypomanic: Secondary | ICD-10-CM | POA: Diagnosis not present

## 2018-12-19 DIAGNOSIS — F4481 Dissociative identity disorder: Secondary | ICD-10-CM | POA: Diagnosis not present

## 2018-12-26 DIAGNOSIS — M47894 Other spondylosis, thoracic region: Secondary | ICD-10-CM | POA: Diagnosis not present

## 2018-12-26 DIAGNOSIS — M546 Pain in thoracic spine: Secondary | ICD-10-CM | POA: Diagnosis not present

## 2018-12-26 DIAGNOSIS — M7918 Myalgia, other site: Secondary | ICD-10-CM | POA: Diagnosis not present

## 2018-12-29 DIAGNOSIS — F310 Bipolar disorder, current episode hypomanic: Secondary | ICD-10-CM | POA: Diagnosis not present

## 2018-12-29 DIAGNOSIS — F902 Attention-deficit hyperactivity disorder, combined type: Secondary | ICD-10-CM | POA: Diagnosis not present

## 2018-12-29 DIAGNOSIS — F4481 Dissociative identity disorder: Secondary | ICD-10-CM | POA: Diagnosis not present

## 2018-12-29 DIAGNOSIS — F840 Autistic disorder: Secondary | ICD-10-CM | POA: Diagnosis not present

## 2018-12-30 DIAGNOSIS — F250 Schizoaffective disorder, bipolar type: Secondary | ICD-10-CM | POA: Diagnosis not present

## 2018-12-30 DIAGNOSIS — F902 Attention-deficit hyperactivity disorder, combined type: Secondary | ICD-10-CM | POA: Diagnosis not present

## 2018-12-30 DIAGNOSIS — F411 Generalized anxiety disorder: Secondary | ICD-10-CM | POA: Diagnosis not present

## 2018-12-30 DIAGNOSIS — F4481 Dissociative identity disorder: Secondary | ICD-10-CM | POA: Diagnosis not present

## 2019-01-02 DIAGNOSIS — G894 Chronic pain syndrome: Secondary | ICD-10-CM | POA: Diagnosis not present

## 2019-01-02 DIAGNOSIS — M542 Cervicalgia: Secondary | ICD-10-CM | POA: Diagnosis not present

## 2019-01-02 DIAGNOSIS — M6281 Muscle weakness (generalized): Secondary | ICD-10-CM | POA: Diagnosis not present

## 2019-01-02 DIAGNOSIS — M545 Low back pain: Secondary | ICD-10-CM | POA: Diagnosis not present

## 2019-01-03 DIAGNOSIS — M546 Pain in thoracic spine: Secondary | ICD-10-CM | POA: Diagnosis not present

## 2019-01-03 DIAGNOSIS — M47814 Spondylosis without myelopathy or radiculopathy, thoracic region: Secondary | ICD-10-CM | POA: Diagnosis not present

## 2019-01-04 DIAGNOSIS — M542 Cervicalgia: Secondary | ICD-10-CM | POA: Diagnosis not present

## 2019-01-04 DIAGNOSIS — G894 Chronic pain syndrome: Secondary | ICD-10-CM | POA: Diagnosis not present

## 2019-01-04 DIAGNOSIS — M545 Low back pain: Secondary | ICD-10-CM | POA: Diagnosis not present

## 2019-01-04 DIAGNOSIS — M6281 Muscle weakness (generalized): Secondary | ICD-10-CM | POA: Diagnosis not present

## 2019-01-09 DIAGNOSIS — F310 Bipolar disorder, current episode hypomanic: Secondary | ICD-10-CM | POA: Diagnosis not present

## 2019-01-09 DIAGNOSIS — F4481 Dissociative identity disorder: Secondary | ICD-10-CM | POA: Diagnosis not present

## 2019-01-09 DIAGNOSIS — F902 Attention-deficit hyperactivity disorder, combined type: Secondary | ICD-10-CM | POA: Diagnosis not present

## 2019-01-09 DIAGNOSIS — F840 Autistic disorder: Secondary | ICD-10-CM | POA: Diagnosis not present

## 2019-01-26 DIAGNOSIS — F4481 Dissociative identity disorder: Secondary | ICD-10-CM | POA: Diagnosis not present

## 2019-01-26 DIAGNOSIS — F840 Autistic disorder: Secondary | ICD-10-CM | POA: Diagnosis not present

## 2019-01-26 DIAGNOSIS — F310 Bipolar disorder, current episode hypomanic: Secondary | ICD-10-CM | POA: Diagnosis not present

## 2019-01-26 DIAGNOSIS — F902 Attention-deficit hyperactivity disorder, combined type: Secondary | ICD-10-CM | POA: Diagnosis not present

## 2019-01-30 DIAGNOSIS — J849 Interstitial pulmonary disease, unspecified: Secondary | ICD-10-CM | POA: Diagnosis not present

## 2019-01-30 DIAGNOSIS — R0600 Dyspnea, unspecified: Secondary | ICD-10-CM | POA: Diagnosis not present

## 2019-01-30 DIAGNOSIS — R634 Abnormal weight loss: Secondary | ICD-10-CM | POA: Diagnosis not present

## 2019-01-30 DIAGNOSIS — R932 Abnormal findings on diagnostic imaging of liver and biliary tract: Secondary | ICD-10-CM | POA: Diagnosis not present

## 2019-01-30 DIAGNOSIS — M797 Fibromyalgia: Secondary | ICD-10-CM | POA: Diagnosis not present

## 2019-01-30 DIAGNOSIS — M3310 Other dermatopolymyositis, organ involvement unspecified: Secondary | ICD-10-CM | POA: Diagnosis not present

## 2019-01-30 DIAGNOSIS — Z716 Tobacco abuse counseling: Secondary | ICD-10-CM | POA: Diagnosis not present

## 2019-01-30 DIAGNOSIS — R413 Other amnesia: Secondary | ICD-10-CM | POA: Diagnosis not present

## 2019-01-30 DIAGNOSIS — R0689 Other abnormalities of breathing: Secondary | ICD-10-CM | POA: Diagnosis not present

## 2019-01-30 DIAGNOSIS — F250 Schizoaffective disorder, bipolar type: Secondary | ICD-10-CM | POA: Diagnosis not present

## 2019-01-30 DIAGNOSIS — K769 Liver disease, unspecified: Secondary | ICD-10-CM | POA: Diagnosis not present

## 2019-01-30 DIAGNOSIS — L259 Unspecified contact dermatitis, unspecified cause: Secondary | ICD-10-CM | POA: Diagnosis not present

## 2019-02-06 DIAGNOSIS — R0789 Other chest pain: Secondary | ICD-10-CM | POA: Diagnosis not present

## 2019-02-06 DIAGNOSIS — Z888 Allergy status to other drugs, medicaments and biological substances status: Secondary | ICD-10-CM | POA: Diagnosis not present

## 2019-02-06 DIAGNOSIS — M797 Fibromyalgia: Secondary | ICD-10-CM | POA: Diagnosis not present

## 2019-02-06 DIAGNOSIS — Z885 Allergy status to narcotic agent status: Secondary | ICD-10-CM | POA: Diagnosis not present

## 2019-02-06 DIAGNOSIS — Z91048 Other nonmedicinal substance allergy status: Secondary | ICD-10-CM | POA: Diagnosis not present

## 2019-02-06 DIAGNOSIS — J449 Chronic obstructive pulmonary disease, unspecified: Secondary | ICD-10-CM | POA: Diagnosis not present

## 2019-02-06 DIAGNOSIS — Z20828 Contact with and (suspected) exposure to other viral communicable diseases: Secondary | ICD-10-CM | POA: Diagnosis not present

## 2019-02-06 DIAGNOSIS — R079 Chest pain, unspecified: Secondary | ICD-10-CM | POA: Diagnosis not present

## 2019-02-06 DIAGNOSIS — E119 Type 2 diabetes mellitus without complications: Secondary | ICD-10-CM | POA: Diagnosis not present

## 2019-02-06 DIAGNOSIS — Z91040 Latex allergy status: Secondary | ICD-10-CM | POA: Diagnosis not present

## 2019-02-06 DIAGNOSIS — R072 Precordial pain: Secondary | ICD-10-CM | POA: Diagnosis not present

## 2019-02-06 DIAGNOSIS — Z79899 Other long term (current) drug therapy: Secondary | ICD-10-CM | POA: Diagnosis not present

## 2019-02-06 DIAGNOSIS — Z7984 Long term (current) use of oral hypoglycemic drugs: Secondary | ICD-10-CM | POA: Diagnosis not present

## 2019-02-06 DIAGNOSIS — F17210 Nicotine dependence, cigarettes, uncomplicated: Secondary | ICD-10-CM | POA: Diagnosis not present

## 2019-02-06 DIAGNOSIS — R0602 Shortness of breath: Secondary | ICD-10-CM | POA: Diagnosis not present

## 2019-02-13 DIAGNOSIS — F4312 Post-traumatic stress disorder, chronic: Secondary | ICD-10-CM | POA: Diagnosis not present

## 2019-02-13 DIAGNOSIS — F250 Schizoaffective disorder, bipolar type: Secondary | ICD-10-CM | POA: Diagnosis not present

## 2019-02-13 DIAGNOSIS — F411 Generalized anxiety disorder: Secondary | ICD-10-CM | POA: Diagnosis not present

## 2019-02-13 DIAGNOSIS — F331 Major depressive disorder, recurrent, moderate: Secondary | ICD-10-CM | POA: Diagnosis not present

## 2019-03-13 DIAGNOSIS — F4312 Post-traumatic stress disorder, chronic: Secondary | ICD-10-CM | POA: Diagnosis not present

## 2019-03-13 DIAGNOSIS — F250 Schizoaffective disorder, bipolar type: Secondary | ICD-10-CM | POA: Diagnosis not present

## 2019-03-13 DIAGNOSIS — F411 Generalized anxiety disorder: Secondary | ICD-10-CM | POA: Diagnosis not present

## 2019-03-13 DIAGNOSIS — F331 Major depressive disorder, recurrent, moderate: Secondary | ICD-10-CM | POA: Diagnosis not present

## 2019-03-29 DIAGNOSIS — R112 Nausea with vomiting, unspecified: Secondary | ICD-10-CM | POA: Diagnosis not present

## 2019-03-29 DIAGNOSIS — K824 Cholesterolosis of gallbladder: Secondary | ICD-10-CM | POA: Diagnosis not present

## 2019-03-29 DIAGNOSIS — M797 Fibromyalgia: Secondary | ICD-10-CM | POA: Diagnosis not present

## 2019-03-29 DIAGNOSIS — R1011 Right upper quadrant pain: Secondary | ICD-10-CM | POA: Diagnosis not present

## 2019-03-29 DIAGNOSIS — L259 Unspecified contact dermatitis, unspecified cause: Secondary | ICD-10-CM | POA: Diagnosis not present

## 2019-03-29 DIAGNOSIS — K769 Liver disease, unspecified: Secondary | ICD-10-CM | POA: Diagnosis not present

## 2019-03-29 DIAGNOSIS — R413 Other amnesia: Secondary | ICD-10-CM | POA: Diagnosis not present

## 2019-04-18 DIAGNOSIS — F411 Generalized anxiety disorder: Secondary | ICD-10-CM | POA: Diagnosis not present

## 2019-04-18 DIAGNOSIS — F250 Schizoaffective disorder, bipolar type: Secondary | ICD-10-CM | POA: Diagnosis not present

## 2019-04-18 DIAGNOSIS — F331 Major depressive disorder, recurrent, moderate: Secondary | ICD-10-CM | POA: Diagnosis not present

## 2019-04-18 DIAGNOSIS — F4312 Post-traumatic stress disorder, chronic: Secondary | ICD-10-CM | POA: Diagnosis not present

## 2019-04-19 DIAGNOSIS — F332 Major depressive disorder, recurrent severe without psychotic features: Secondary | ICD-10-CM | POA: Diagnosis not present

## 2019-05-03 DIAGNOSIS — R7303 Prediabetes: Secondary | ICD-10-CM | POA: Diagnosis not present

## 2019-05-03 DIAGNOSIS — F17210 Nicotine dependence, cigarettes, uncomplicated: Secondary | ICD-10-CM | POA: Diagnosis not present

## 2019-05-03 DIAGNOSIS — F900 Attention-deficit hyperactivity disorder, predominantly inattentive type: Secondary | ICD-10-CM | POA: Diagnosis not present

## 2019-05-03 DIAGNOSIS — Z0000 Encounter for general adult medical examination without abnormal findings: Secondary | ICD-10-CM | POA: Diagnosis not present

## 2019-05-03 DIAGNOSIS — Z86010 Personal history of colonic polyps: Secondary | ICD-10-CM | POA: Diagnosis not present

## 2019-05-03 DIAGNOSIS — E782 Mixed hyperlipidemia: Secondary | ICD-10-CM | POA: Diagnosis not present

## 2019-05-03 DIAGNOSIS — I10 Essential (primary) hypertension: Secondary | ICD-10-CM | POA: Diagnosis not present

## 2019-05-03 DIAGNOSIS — J449 Chronic obstructive pulmonary disease, unspecified: Secondary | ICD-10-CM | POA: Diagnosis not present

## 2019-05-19 ENCOUNTER — Emergency Department (INDEPENDENT_AMBULATORY_CARE_PROVIDER_SITE_OTHER)
Admission: EM | Admit: 2019-05-19 | Discharge: 2019-05-19 | Disposition: A | Discharge status: Home / Self Care | Payer: BC Managed Care – PPO | Source: Home / Self Care | Attending: Family Medicine | Admitting: Family Medicine

## 2019-05-19 ENCOUNTER — Emergency Department (INDEPENDENT_AMBULATORY_CARE_PROVIDER_SITE_OTHER): Payer: BC Managed Care – PPO

## 2019-05-19 ENCOUNTER — Other Ambulatory Visit: Payer: Self-pay

## 2019-05-19 ENCOUNTER — Encounter: Payer: Self-pay | Admitting: Emergency Medicine

## 2019-05-19 DIAGNOSIS — M542 Cervicalgia: Secondary | ICD-10-CM | POA: Diagnosis not present

## 2019-05-19 DIAGNOSIS — M5412 Radiculopathy, cervical region: Secondary | ICD-10-CM

## 2019-05-19 MED ORDER — KETOROLAC TROMETHAMINE 60 MG/2ML IM SOLN
60.0000 mg | Freq: Once | INTRAMUSCULAR | Status: AC
  Administered 2019-05-19: 21:00:00 60 mg via INTRAMUSCULAR

## 2019-05-19 MED ORDER — HYDROCODONE-ACETAMINOPHEN 5-325 MG PO TABS: 1.0000 | ORAL_TABLET | Freq: Four times a day (QID) | ORAL | 0 refills | Status: DC | PRN

## 2019-05-19 NOTE — ED Triage Notes (Signed)
While taking Vital Signs noticed white pain on patient's arms; upon questioning, she has be remodeling a house for past 2 months including painting walls and ceilings and worked on this today. Has taken tylenol and meloxican today at 1300; denies taking her rx for hydrocodone because it is 40 years old; she has 4 tablets left.

## 2019-05-19 NOTE — ED Triage Notes (Signed)
Patient here reporting pain on left side of neck and radiating along shoulder left shoulder; has chronic pain in this area but awoke 1 1/2 -2 weeks ago with this overlapping pain.

## 2019-05-19 NOTE — Discharge Instructions (Addendum)
Apply ice pack to back of neck for 20 to 30 minutes, 3 to 4 times daily  Continue until pain decreases.  Wear soft cervical collar at night.  May take your muscle relaxant if helpful.

## 2019-05-19 NOTE — ED Triage Notes (Deleted)
Patient reports numerous errors in the list of medication allergies in her chart; cannot cancel them ; the following are her correct allergies: Gabapentin Corticosteroids topiramate lyrica SSRI chantix Latex  vraylar risperdal

## 2019-05-19 NOTE — ED Triage Notes (Signed)
Patient reports numerous errors in the list of medication allergies in her chart; cannot cancel them ; the following are her correct allergies: Gabapentin Corticosteroids topiramate lyrica SSRI chantix Latex  vraylar risperdal        Deleted by: Royetta Asal, RN at 05/19/2019 8:15 PM

## 2019-05-19 NOTE — ED Provider Notes (Addendum)
Brenda Perry CARE    CSN: [id number] Arrival date & time: 05/19/19  1927      History   Chief Complaint Chief Complaint  Patient presents with  . Neck Pain    HPI Brenda Perry is a 39 y.o. female.   Patient complains of pain in her left neck radiating to her left shoulder and arm for about 2 weeks.  She has occasional numbness in her left 3rd, 4th, and 5th fingers.  She recalls rolling over backwards in bed two weeks ago followed by neck pain that has persisted.   She states that she already has a soft cervical collar at home, and a muscle relaxant that she takes rarely.  The history is provided by the patient and the spouse.    Past Medical History:  Diagnosis Date  . Bipolar 1 disorder ([place])   . Bipolar disorder ([place])   . Depression   . Endometriosis 2011  . Erosion of vaginal mesh ([place])   . H/O abnormal Pap smear    cryo  . HTN (hypertension)   . Hyperlipidemia   . Hypertension   . IBS (irritable bowel syndrome)   . Obesity   . OCD (obsessive compulsive disorder)   . Ovarian cyst 2011  . PCOS (polycystic ovarian syndrome)   . Pneumonia 2010  . Polycystic ovary syndrome   . PSVT (paroxysmal supraventricular tachycardia) [facility])     Patient Active Problem List   Diagnosis Date Noted  . Chest pain 07/07/2012  . Flushing 07/07/2012  . Palpitations 04/27/2012  . Tobacco abuse 04/27/2012  . Schizoaffective disorder, bipolar type ([place]) 03/03/2012  . Abnormal Pap smear     Past Surgical History:  Procedure Laterality Date  . ABDOMINAL HYSTERECTOMY    . ADENOIDECTOMY    . breast tissure bx-fibrosis    . INCONTINENCE SURGERY    . LAPAROSCOPY  1996  2011  . lavh  2008  . TONSILLECTOMY     `  . TONSILLECTOMY AND ADENOIDECTOMY  1996  . vaginal/rectal repair  2008    OB History    Gravida  2   Para  2   Term  2   Preterm      AB      Living  2     SAB      TAB      Ectopic      Multiple      Live Births                Home Medications    Prior to Admission medications   Medication Sig Start Date End Date Taking? Authorizing Provider  hydroxychloroquine (PLAQUENIL) 200 MG tablet Take by mouth daily.   Yes [provider]  lamoTRIgine (LAMICTAL) 100 MG tablet Take 100 mg by mouth 2 (two) times daily.   Yes [provider]  acetaminophen (TYLENOL) 500 MG tablet Take 1,000 mg by mouth every 8 (eight) hours as needed.    [provider]  albuterol (PROVENTIL HFA;VENTOLIN HFA) 108 (90 Base) MCG/ACT inhaler Inhale into the lungs every 6 (six) hours as needed for wheezing or shortness of breath.    [provider]  ALPRAZolam Duanne Moron) 1 MG tablet Take 1 mg by mouth at bedtime as needed for anxiety.    [provider]  amphetamine-dextroamphetamine (ADDERALL XR) 30 MG 24 hr capsule Take 30 mg by mouth daily.    [provider]  aspirin EC 81 MG tablet Take 81 mg  by mouth daily.    [provider]  Biotin (BIOTIN MAXIMUM STRENGTH) 10 MG TABS Take by mouth.    [provider]  furosemide (LASIX) 20 MG tablet Take 20 mg by mouth.    [provider]  HYDROcodone-acetaminophen (NORCO/VICODIN) 5-325 MG tablet Take 1 tablet by mouth every 6 (six) hours as needed for moderate pain or severe pain. 05/19/19   Lattie Haw, MD  metFORMIN (GLUCOPHAGE) 1000 MG tablet Take 1,000 mg by mouth 2 (two) times daily.    [provider]  methocarbamol (ROBAXIN) 750 MG tablet Take 750 mg by mouth every 6 (six) hours as needed for muscle spasms.    [provider]  nebivolol (BYSTOLIC) 10 MG tablet Take by mouth.    [provider]  pantoprazole (PROTONIX) 40 MG tablet Take 40 mg by mouth every morning.    [provider]  ziprasidone (GEODON) 40 MG capsule Take 40 mg by mouth every evening.    [provider]    Family History Family History  Problem Relation Age of Onset  . Diabetes Other   . Hypertension  Other   . Cancer Other   . Vasculitis Other   . Fibromyalgia Other   . Osteoporosis Other   . Heart disease Other   . Cancer Father        femure  . Hypertension Father   . Hyperlipidemia Father   . Diabetes Father   . Vasculitis Father        Cerbral Vasculitis  . Cancer Mother        uterine  . Hyperlipidemia Mother   . Fibromyalgia Mother   . Cancer Paternal Aunt        vaginal  . Endometriosis Cousin   . Lupus Cousin   . Heart disease Paternal Grandmother   . Diabetes Paternal Grandmother   . Thyroid disease Paternal Grandmother   . Heart disease Paternal Aunt   . Heart disease Maternal Grandfather        A-Fib  . Hyperlipidemia Maternal Grandfather   . Hyperlipidemia Maternal Grandmother   . Coronary artery disease Paternal Grandfather   . Aneurysm Paternal Uncle     Social History Social History   Tobacco Use  . Smoking status: Current Every Day Smoker    Packs/day: 1.00    Years: 15.00    Pack years: 15.00    Types: Cigarettes  . Smokeless tobacco: Never Used  Substance Use Topics  . Alcohol use: Yes    Comment: jan 2013  . Drug use: No    Comment: Past use 21.     Allergies   Ativan [lorazepam], Corticosteroids, Cymbalta [duloxetine hcl], Depakote [divalproex sodium], Effexor [venlafaxine hcl], Geodon [ziprasidone], Lamictal [lamotrigine], Latex, Lithium, Neurontin [gabapentin], Other, Paxil [paroxetine hcl], Prozac [fluoxetine], Risperdal [risperidone], Seroquel [quetiapine fumarate], Xanax [alprazolam], Zoloft [sertraline hcl], and Latex   Review of Systems Review of Systems  Constitutional: Positive for activity change. Negative for chills, diaphoresis, fatigue and fever.  HENT: Negative.   Eyes: Negative.   Respiratory: Negative.   Cardiovascular: Negative.   Gastrointestinal: Negative.   Genitourinary: Negative.   Musculoskeletal: Positive for neck pain and neck stiffness. Negative for back pain.  Skin: Negative.   Neurological: Positive  for numbness.     Physical Exam Triage Vital Signs ED Triage Vitals  Enc Vitals Group     BP 05/19/19 2027 120/89     Pulse Rate 05/19/19 2027 84     Resp 05/19/19  2027 18     Temp 05/19/19 2027 98.2 F (36.8 C)     Temp Source 05/19/19 2027 Oral     SpO2 05/19/19 2027 97 %     Weight 05/19/19 1952 147 lb (66.7 kg)     Height 05/19/19 1952 5\' 5"  (1.651 m)     Head Circumference --      Peak Flow --      Pain Score 05/19/19 1950 8     Pain Loc --      Pain Edu? --      Excl. in GC? --    No data found.  Updated Vital Signs BP 120/89 (BP Location: Right Arm)   Pulse 84   Temp 98.2 F (36.8 C) (Oral)   Resp 18   Ht 5\' 5"  (1.651 m)   Wt 66.7 kg   LMP 09/12/2006   SpO2 97%   BMI 24.46 kg/m   Visual Acuity Right Eye Distance:   Left Eye Distance:   Bilateral Distance:    Right Eye Near:   Left Eye Near:    Bilateral Near:     Physical Exam Vitals and nursing note reviewed.  Constitutional:      General: She is in acute distress.     Appearance: She is not ill-appearing.     Comments: Patient is tearful, holding her left neck.  HENT:     Head: Normocephalic.     Right Ear: External ear normal.     Left Ear: External ear normal.     Nose: Nose normal.     Mouth/Throat:     Pharynx: Oropharynx is clear.  Eyes:     Conjunctiva/sclera: Conjunctivae normal.     Pupils: Pupils are equal, round, and reactive to light.  Neck:      Comments: Patient has tenderness to palpation over her inferior cervical spine.  Her pain is elicited by neck extension, head rotation to the right, and neck flexion to the right. Cardiovascular:     Rate and Rhythm: Normal rate and regular rhythm.  Pulmonary:     Breath sounds: Normal breath sounds.  Abdominal:     Tenderness: There is no abdominal tenderness.  Musculoskeletal:     Cervical back: Tenderness present. Pain with movement, spinous process tenderness and muscular tenderness present. Decreased range of motion.      Right lower leg: No edema.     Left lower leg: No edema.  Lymphadenopathy:     Cervical: No cervical adenopathy.  Skin:    General: Skin is warm and dry.     Findings: No rash.  Neurological:     Mental Status: She is alert and oriented to person, place, and time.     Sensory: No sensory deficit.      UC Treatments / Results  Labs (all labs ordered are listed, but only abnormal results are displayed) Labs Reviewed - No data to display  EKG   Radiology DG Cervical Spine Complete  Result Date: 05/19/2019 CLINICAL DATA:  39 year old female with neck pain and radiculopathy. EXAM: CERVICAL SPINE - COMPLETE 4+ VIEW COMPARISON:  None. FINDINGS: There is no acute fracture or subluxation of the cervical spine. There is a reversal of normal cervical lordosis which may be positional or due to muscle spasm. The vertebral body heights and disc spaces are maintained. The visualized posterior elements and odontoid are intact. There is anatomic alignment of the lateral masses of C1 and C2. The soft tissues are unremarkable. IMPRESSION:  1. No acute fracture or subluxation. 2. Reversal of normal cervical lordosis. Clinical correlation is recommended. Electronically Signed   By: Elgie Collard M.D.   On: 05/19/2019 21:40    Procedures Procedures (including critical care time)  Medications Ordered in UC Medications  ketorolac (TORADOL) injection 60 mg (60 mg Intramuscular Given 05/19/19 2122)    Initial Impression / Assessment and Plan / UC Course  I have reviewed the triage vital signs and the nursing notes.  Pertinent labs & imaging results that were available during my care of the patient were reviewed by me and considered in my medical decision making (see chart for details).    Administered Toradol 60mg  IM. Note loss of normal cervical lordosis on C-spine X-rays. Patient has an extensive list of drug allergies.  She is also unable to tolerate steroids.  Rx given for Lortab (#16, no  refill) Followup with Dr. ([facility]) as soon as possible for further evaluation and treatment.   Final Clinical Impressions(s) / UC Diagnoses   Final diagnoses:  Neck pain  Cervical radiculopathy     Discharge Instructions     Apply ice pack to back of neck for 20 to 30 minutes, 3 to 4 times daily  Continue until pain decreases.  Wear soft cervical collar at night.  May take your muscle relaxant if helpful.    ED Prescriptions    Medication Sig Dispense Auth. Provider   HYDROcodone-acetaminophen (NORCO/VICODIN) 5-325 MG tablet Take 1 tablet by mouth every 6 (six) hours as needed for moderate pain or severe pain. 16 tablet Brenda Langton, MD        Lattie Haw, MD 05/20/19 1416  Addendum: Controlled Substance Prescriptions I have consulted the [place] Controlled Substances Registry for this patient, and feel the risk/benefit ratio today is favorable for proceeding with this prescription for a controlled substance.     05/22/19, MD 05/20/19 [phone]

## 2019-05-22 ENCOUNTER — Telehealth: Payer: Self-pay

## 2019-05-22 DIAGNOSIS — F4310 Post-traumatic stress disorder, unspecified: Secondary | ICD-10-CM | POA: Diagnosis not present

## 2019-05-22 DIAGNOSIS — F429 Obsessive-compulsive disorder, unspecified: Secondary | ICD-10-CM | POA: Diagnosis not present

## 2019-05-22 DIAGNOSIS — F411 Generalized anxiety disorder: Secondary | ICD-10-CM | POA: Diagnosis not present

## 2019-05-22 NOTE — Telephone Encounter (Signed)
Made an appointment with Dr T on 2/24 at 845.  Left message on patients VM and contact information for PC if she needs to change it.

## 2019-05-24 ENCOUNTER — Other Ambulatory Visit: Payer: Self-pay

## 2019-05-24 ENCOUNTER — Encounter: Payer: Self-pay | Admitting: Sports Medicine

## 2019-05-24 ENCOUNTER — Ambulatory Visit (INDEPENDENT_AMBULATORY_CARE_PROVIDER_SITE_OTHER): Payer: BC Managed Care – PPO | Admitting: Sports Medicine

## 2019-05-24 DIAGNOSIS — S134XXA Sprain of ligaments of cervical spine, initial encounter: Secondary | ICD-10-CM | POA: Diagnosis not present

## 2019-05-24 DIAGNOSIS — G959 Disease of spinal cord, unspecified: Secondary | ICD-10-CM | POA: Insufficient documentation

## 2019-05-24 MED ORDER — HYDROCODONE-ACETAMINOPHEN 5-325 MG PO TABS: 1.0000 | ORAL_TABLET | Freq: Four times a day (QID) | ORAL | 0 refills | Status: DC | PRN

## 2019-05-24 MED ORDER — CELECOXIB 200 MG PO CAPS: ORAL_CAPSULE | ORAL | 2 refills | Status: DC

## 2019-05-24 MED ORDER — METHOCARBAMOL 750 MG PO TABS: 750.0000 mg | ORAL_TABLET | Freq: Three times a day (TID) | ORAL | 0 refills | Status: DC | PRN

## 2019-05-24 NOTE — Assessment & Plan Note (Signed)
2 weeks ago this pleasant 39 year old female was wrestling with her husband, she went to do a back flip and her neck was twisted. She did not have immediate pain but the next day she developed pain in her neck with radiation down the left arm and shoulder, she does have baseline paresthesias into the fourth and fifth fingers but worsening now. She was given a shot of Toradol but no steroids as the stressor into psychosis. X-rays show straightening of the normal cervical lordosis, there is likely cervical spasm, there is also likely an underlying disc protrusion. Adding home rehab exercises, refilling Robaxin and hydrocodone, adding celecoxib. Return to see me in 3 weeks or so to reevaluate, cervical spine MRI if no better.

## 2019-05-24 NOTE — Progress Notes (Signed)
    Procedures performed today:    None.  Independent interpretation of tests performed by another provider:   I personally reviewed her cervical spine x-rays that show straightening of the normal cervical lordosis.  I also reviewed her urgent care notes.  Also reviewed her labs, renal function is normal, mild increased hemoglobin levels.  Impression and Recommendations:    Whiplash injury to neck 2 weeks ago this pleasant 39 year old female was wrestling with her husband, she went to do a back flip and her neck was twisted. She did not have immediate pain but the next day she developed pain in her neck with radiation down the left arm and shoulder, she does have baseline paresthesias into the fourth and fifth fingers but worsening now. She was given a shot of Toradol but no steroids as the stressor into psychosis. X-rays show straightening of the normal cervical lordosis, there is likely cervical spasm, there is also likely an underlying disc protrusion. Adding home rehab exercises, refilling Robaxin and hydrocodone, adding celecoxib. Return to see me in 3 weeks or so to reevaluate, cervical spine MRI if no better.    ___________________________________________ Ihor Austin. Benjamin Stain, M.D., ABFM., CAQSM. Primary Care and Sports Medicine [facility] [facility]  Adjunct Instructor of Family Medicine  [employer]

## 2019-05-25 DIAGNOSIS — F250 Schizoaffective disorder, bipolar type: Secondary | ICD-10-CM | POA: Diagnosis not present

## 2019-05-25 DIAGNOSIS — F411 Generalized anxiety disorder: Secondary | ICD-10-CM | POA: Diagnosis not present

## 2019-05-25 DIAGNOSIS — F4312 Post-traumatic stress disorder, chronic: Secondary | ICD-10-CM | POA: Diagnosis not present

## 2019-05-25 DIAGNOSIS — F331 Major depressive disorder, recurrent, moderate: Secondary | ICD-10-CM | POA: Diagnosis not present

## 2019-05-26 DIAGNOSIS — Z01419 Encounter for gynecological examination (general) (routine) without abnormal findings: Secondary | ICD-10-CM | POA: Diagnosis not present

## 2019-05-26 DIAGNOSIS — Z1151 Encounter for screening for human papillomavirus (HPV): Secondary | ICD-10-CM | POA: Diagnosis not present

## 2019-05-26 DIAGNOSIS — I10 Essential (primary) hypertension: Secondary | ICD-10-CM | POA: Diagnosis not present

## 2019-05-26 DIAGNOSIS — Z1272 Encounter for screening for malignant neoplasm of vagina: Secondary | ICD-10-CM | POA: Diagnosis not present

## 2019-05-26 DIAGNOSIS — Z1231 Encounter for screening mammogram for malignant neoplasm of breast: Secondary | ICD-10-CM | POA: Diagnosis not present

## 2019-05-29 DIAGNOSIS — Z1231 Encounter for screening mammogram for malignant neoplasm of breast: Secondary | ICD-10-CM | POA: Diagnosis not present

## 2019-05-30 DIAGNOSIS — N6321 Unspecified lump in the left breast, upper outer quadrant: Secondary | ICD-10-CM | POA: Diagnosis not present

## 2019-05-30 DIAGNOSIS — R928 Other abnormal and inconclusive findings on diagnostic imaging of breast: Secondary | ICD-10-CM | POA: Diagnosis not present

## 2019-06-01 DIAGNOSIS — F319 Bipolar disorder, unspecified: Secondary | ICD-10-CM | POA: Diagnosis not present

## 2019-06-01 DIAGNOSIS — F4310 Post-traumatic stress disorder, unspecified: Secondary | ICD-10-CM | POA: Diagnosis not present

## 2019-06-01 DIAGNOSIS — F411 Generalized anxiety disorder: Secondary | ICD-10-CM | POA: Diagnosis not present

## 2019-06-01 DIAGNOSIS — F429 Obsessive-compulsive disorder, unspecified: Secondary | ICD-10-CM | POA: Diagnosis not present

## 2019-06-06 DIAGNOSIS — M7918 Myalgia, other site: Secondary | ICD-10-CM | POA: Diagnosis not present

## 2019-06-06 DIAGNOSIS — N6082 Other benign mammary dysplasias of left breast: Secondary | ICD-10-CM | POA: Diagnosis not present

## 2019-06-06 DIAGNOSIS — R920 Mammographic microcalcification found on diagnostic imaging of breast: Secondary | ICD-10-CM | POA: Diagnosis not present

## 2019-06-06 DIAGNOSIS — R928 Other abnormal and inconclusive findings on diagnostic imaging of breast: Secondary | ICD-10-CM | POA: Diagnosis not present

## 2019-06-06 DIAGNOSIS — N6032 Fibrosclerosis of left breast: Secondary | ICD-10-CM | POA: Diagnosis not present

## 2019-06-06 DIAGNOSIS — N62 Hypertrophy of breast: Secondary | ICD-10-CM | POA: Diagnosis not present

## 2019-06-14 ENCOUNTER — Encounter: Payer: Self-pay | Admitting: Sports Medicine

## 2019-06-14 ENCOUNTER — Other Ambulatory Visit: Payer: Self-pay

## 2019-06-14 ENCOUNTER — Ambulatory Visit (INDEPENDENT_AMBULATORY_CARE_PROVIDER_SITE_OTHER): Payer: BC Managed Care – PPO | Admitting: Sports Medicine

## 2019-06-14 DIAGNOSIS — S134XXA Sprain of ligaments of cervical spine, initial encounter: Secondary | ICD-10-CM | POA: Diagnosis not present

## 2019-06-14 DIAGNOSIS — M5412 Radiculopathy, cervical region: Secondary | ICD-10-CM | POA: Diagnosis not present

## 2019-06-14 MED ORDER — METHOCARBAMOL 750 MG PO TABS: 750.0000 mg | ORAL_TABLET | Freq: Three times a day (TID) | ORAL | 0 refills | Status: DC | PRN

## 2019-06-14 MED ORDER — CELECOXIB 200 MG PO CAPS: ORAL_CAPSULE | ORAL | 2 refills | Status: DC

## 2019-06-14 MED ORDER — HYDROCODONE-ACETAMINOPHEN 5-325 MG PO TABS: 1.0000 | ORAL_TABLET | Freq: Four times a day (QID) | ORAL | 0 refills | Status: DC | PRN

## 2019-06-14 NOTE — Progress Notes (Signed)
    Procedures performed today:    None.  Independent interpretation of notes and tests performed by another provider:   None.  Impression and Recommendations:    Left cervical radiculopathy Brenda Perry returns, she is a pleasant 39 year old female, she was wrestling with her husband, went to do a back flip and her neck was twisted. She has severe pain in her neck with radiation down the left arm to the fourth and fifth fingers. She does endorse a nerve conduction and EMG in the past that showed ulnar neuropathy on the side. Historically steroids do send her into psychosis. At this point she is having persistent pain in spite of physician directed conservative measures, x-rays unrevealing. Declines physical therapy. We are going to proceed with an MRI for interventional planning. Of note we would likely need to start an antipsychotic a day before and may be for 2 weeks after her epidural injection. She will also touch base with her psychiatrist regarding this. Refilling Robaxin, Celebrex, hydrocodone but only for 5 days and she is going to get a referral to pain management. It sounds as though she would prefer a medical approach rather than interventional and I did advise her that I am not set up to do chronic narcotics. Certainly with her fibromyalgia and concurrent mood and thought disorders it will be very difficult to fully control her pain.    ___________________________________________ Ihor Austin. Benjamin Stain, M.D., ABFM., CAQSM. Primary Care and Sports Medicine [place] [facility] [facility]  Adjunct Instructor of Family Medicine  [employer]

## 2019-06-14 NOTE — Assessment & Plan Note (Signed)
Brenda Perry returns, she is a pleasant 39 year old female, she was wrestling with her husband, went to do a back flip and her neck was twisted. She has severe pain in her neck with radiation down the left arm to the fourth and fifth fingers. She does endorse a nerve conduction and EMG in the past that showed ulnar neuropathy on the side. Historically steroids do send her into psychosis. At this point she is having persistent pain in spite of physician directed conservative measures, x-rays unrevealing. Declines physical therapy. We are going to proceed with an MRI for interventional planning. Of note we would likely need to start an antipsychotic a day before and may be for 2 weeks after her epidural injection. She will also touch base with her psychiatrist regarding this. Refilling Robaxin, Celebrex, hydrocodone but only for 5 days and she is going to get a referral to pain management. It sounds as though she would prefer a medical approach rather than interventional and I did advise her that I am not set up to do chronic narcotics. Certainly with her fibromyalgia and concurrent mood and thought disorders it will be very difficult to fully control her pain.

## 2019-06-19 DIAGNOSIS — N644 Mastodynia: Secondary | ICD-10-CM | POA: Diagnosis not present

## 2019-06-19 DIAGNOSIS — R928 Other abnormal and inconclusive findings on diagnostic imaging of breast: Secondary | ICD-10-CM | POA: Diagnosis not present

## 2019-06-19 DIAGNOSIS — Z803 Family history of malignant neoplasm of breast: Secondary | ICD-10-CM | POA: Diagnosis not present

## 2019-06-19 DIAGNOSIS — Z9189 Other specified personal risk factors, not elsewhere classified: Secondary | ICD-10-CM | POA: Diagnosis not present

## 2019-06-19 DIAGNOSIS — I10 Essential (primary) hypertension: Secondary | ICD-10-CM | POA: Diagnosis not present

## 2019-06-19 DIAGNOSIS — N6310 Unspecified lump in the right breast, unspecified quadrant: Secondary | ICD-10-CM | POA: Diagnosis not present

## 2019-06-24 ENCOUNTER — Other Ambulatory Visit: Payer: BC Managed Care – PPO

## 2019-06-26 DIAGNOSIS — M797 Fibromyalgia: Secondary | ICD-10-CM | POA: Diagnosis not present

## 2019-06-26 DIAGNOSIS — Z79899 Other long term (current) drug therapy: Secondary | ICD-10-CM | POA: Diagnosis not present

## 2019-06-26 DIAGNOSIS — M7918 Myalgia, other site: Secondary | ICD-10-CM | POA: Diagnosis not present

## 2019-07-01 ENCOUNTER — Other Ambulatory Visit: Payer: Self-pay

## 2019-07-01 ENCOUNTER — Ambulatory Visit (INDEPENDENT_AMBULATORY_CARE_PROVIDER_SITE_OTHER): Payer: BC Managed Care – PPO

## 2019-07-01 DIAGNOSIS — M4802 Spinal stenosis, cervical region: Secondary | ICD-10-CM | POA: Diagnosis not present

## 2019-07-01 DIAGNOSIS — M5412 Radiculopathy, cervical region: Secondary | ICD-10-CM

## 2019-07-01 DIAGNOSIS — M50221 Other cervical disc displacement at C4-C5 level: Secondary | ICD-10-CM | POA: Diagnosis not present

## 2019-07-03 DIAGNOSIS — F429 Obsessive-compulsive disorder, unspecified: Secondary | ICD-10-CM | POA: Diagnosis not present

## 2019-07-03 DIAGNOSIS — F4310 Post-traumatic stress disorder, unspecified: Secondary | ICD-10-CM | POA: Diagnosis not present

## 2019-07-03 DIAGNOSIS — G959 Disease of spinal cord, unspecified: Secondary | ICD-10-CM

## 2019-07-03 NOTE — Assessment & Plan Note (Addendum)
Brenda Perry has cervical radicular symptoms with weakness, on MRI she does have significant T2 signal in her spinal cord with severe central stenosis consistent with spinal cord compression and myelopathy. I do think she needs a surgical opinion ASAP, her request is Dr. Sharolyn Douglas. I did offer high-dose dexamethasone however patient declines steroids as she does have a history of schizophrenia.  Historically steroids put her into psychosis.

## 2019-07-05 DIAGNOSIS — M5000 Cervical disc disorder with myelopathy, unspecified cervical region: Secondary | ICD-10-CM | POA: Diagnosis not present

## 2019-07-05 DIAGNOSIS — M4712 Other spondylosis with myelopathy, cervical region: Secondary | ICD-10-CM | POA: Diagnosis not present

## 2019-07-05 DIAGNOSIS — M5412 Radiculopathy, cervical region: Secondary | ICD-10-CM | POA: Diagnosis not present

## 2019-07-05 DIAGNOSIS — M546 Pain in thoracic spine: Secondary | ICD-10-CM | POA: Diagnosis not present

## 2019-07-07 DIAGNOSIS — E875 Hyperkalemia: Secondary | ICD-10-CM | POA: Diagnosis not present

## 2019-07-12 DIAGNOSIS — M5000 Cervical disc disorder with myelopathy, unspecified cervical region: Secondary | ICD-10-CM | POA: Diagnosis not present

## 2019-07-12 DIAGNOSIS — Z20822 Contact with and (suspected) exposure to covid-19: Secondary | ICD-10-CM | POA: Diagnosis not present

## 2019-07-12 DIAGNOSIS — Z01812 Encounter for preprocedural laboratory examination: Secondary | ICD-10-CM | POA: Diagnosis not present

## 2019-07-13 DIAGNOSIS — M546 Pain in thoracic spine: Secondary | ICD-10-CM | POA: Diagnosis not present

## 2019-07-13 DIAGNOSIS — M542 Cervicalgia: Secondary | ICD-10-CM | POA: Diagnosis not present

## 2019-07-13 DIAGNOSIS — M5000 Cervical disc disorder with myelopathy, unspecified cervical region: Secondary | ICD-10-CM | POA: Diagnosis not present

## 2019-07-13 DIAGNOSIS — Z79899 Other long term (current) drug therapy: Secondary | ICD-10-CM | POA: Diagnosis not present

## 2019-07-13 DIAGNOSIS — Z01812 Encounter for preprocedural laboratory examination: Secondary | ICD-10-CM | POA: Diagnosis not present

## 2019-07-13 DIAGNOSIS — G959 Disease of spinal cord, unspecified: Secondary | ICD-10-CM | POA: Diagnosis not present

## 2019-07-13 DIAGNOSIS — M5412 Radiculopathy, cervical region: Secondary | ICD-10-CM | POA: Diagnosis not present

## 2019-07-13 DIAGNOSIS — M4712 Other spondylosis with myelopathy, cervical region: Secondary | ICD-10-CM | POA: Diagnosis not present

## 2019-07-13 DIAGNOSIS — Z4689 Encounter for fitting and adjustment of other specified devices: Secondary | ICD-10-CM | POA: Diagnosis not present

## 2019-07-13 DIAGNOSIS — M50021 Cervical disc disorder at C4-C5 level with myelopathy: Secondary | ICD-10-CM | POA: Diagnosis not present

## 2019-07-13 DIAGNOSIS — M50023 Cervical disc disorder at C6-C7 level with myelopathy: Secondary | ICD-10-CM | POA: Diagnosis not present

## 2019-07-18 DIAGNOSIS — M4322 Fusion of spine, cervical region: Secondary | ICD-10-CM | POA: Diagnosis not present

## 2019-07-18 DIAGNOSIS — M50023 Cervical disc disorder at C6-C7 level with myelopathy: Secondary | ICD-10-CM | POA: Diagnosis not present

## 2019-07-18 DIAGNOSIS — M50021 Cervical disc disorder at C4-C5 level with myelopathy: Secondary | ICD-10-CM | POA: Diagnosis not present

## 2019-07-18 DIAGNOSIS — M5000 Cervical disc disorder with myelopathy, unspecified cervical region: Secondary | ICD-10-CM | POA: Diagnosis not present

## 2019-07-18 DIAGNOSIS — Z981 Arthrodesis status: Secondary | ICD-10-CM | POA: Diagnosis not present

## 2019-07-18 DIAGNOSIS — M50022 Cervical disc disorder at C5-C6 level with myelopathy: Secondary | ICD-10-CM | POA: Diagnosis not present

## 2019-07-18 DIAGNOSIS — M4722 Other spondylosis with radiculopathy, cervical region: Secondary | ICD-10-CM | POA: Diagnosis not present

## 2019-07-18 DIAGNOSIS — M4712 Other spondylosis with myelopathy, cervical region: Secondary | ICD-10-CM | POA: Diagnosis not present

## 2019-08-17 DIAGNOSIS — M5412 Radiculopathy, cervical region: Secondary | ICD-10-CM | POA: Diagnosis not present

## 2019-08-22 DIAGNOSIS — F4310 Post-traumatic stress disorder, unspecified: Secondary | ICD-10-CM | POA: Diagnosis not present

## 2019-08-22 DIAGNOSIS — F411 Generalized anxiety disorder: Secondary | ICD-10-CM | POA: Diagnosis not present

## 2019-08-22 DIAGNOSIS — F319 Bipolar disorder, unspecified: Secondary | ICD-10-CM | POA: Diagnosis not present

## 2019-08-22 DIAGNOSIS — F429 Obsessive-compulsive disorder, unspecified: Secondary | ICD-10-CM | POA: Diagnosis not present

## 2019-10-25 DIAGNOSIS — M5000 Cervical disc disorder with myelopathy, unspecified cervical region: Secondary | ICD-10-CM | POA: Diagnosis not present

## 2019-10-25 DIAGNOSIS — Z6824 Body mass index (BMI) 24.0-24.9, adult: Secondary | ICD-10-CM | POA: Diagnosis not present

## 2019-10-25 DIAGNOSIS — M4322 Fusion of spine, cervical region: Secondary | ICD-10-CM | POA: Diagnosis not present

## 2019-12-06 DIAGNOSIS — I10 Essential (primary) hypertension: Secondary | ICD-10-CM | POA: Diagnosis not present

## 2019-12-06 DIAGNOSIS — I471 Supraventricular tachycardia: Secondary | ICD-10-CM | POA: Diagnosis not present

## 2019-12-06 DIAGNOSIS — E782 Mixed hyperlipidemia: Secondary | ICD-10-CM | POA: Diagnosis not present

## 2019-12-06 DIAGNOSIS — F900 Attention-deficit hyperactivity disorder, predominantly inattentive type: Secondary | ICD-10-CM | POA: Diagnosis not present

## 2019-12-13 DIAGNOSIS — F429 Obsessive-compulsive disorder, unspecified: Secondary | ICD-10-CM | POA: Diagnosis not present

## 2019-12-13 DIAGNOSIS — F4310 Post-traumatic stress disorder, unspecified: Secondary | ICD-10-CM | POA: Diagnosis not present

## 2020-03-04 ENCOUNTER — Ambulatory Visit (INDEPENDENT_AMBULATORY_CARE_PROVIDER_SITE_OTHER): Payer: BC Managed Care – PPO

## 2020-03-04 ENCOUNTER — Ambulatory Visit (INDEPENDENT_AMBULATORY_CARE_PROVIDER_SITE_OTHER): Payer: BC Managed Care – PPO | Admitting: Sports Medicine

## 2020-03-04 ENCOUNTER — Other Ambulatory Visit: Payer: Self-pay

## 2020-03-04 DIAGNOSIS — G959 Disease of spinal cord, unspecified: Secondary | ICD-10-CM

## 2020-03-04 DIAGNOSIS — S134XXA Sprain of ligaments of cervical spine, initial encounter: Secondary | ICD-10-CM

## 2020-03-04 DIAGNOSIS — F250 Schizoaffective disorder, bipolar type: Secondary | ICD-10-CM | POA: Diagnosis not present

## 2020-03-04 DIAGNOSIS — M545 Low back pain, unspecified: Secondary | ICD-10-CM

## 2020-03-04 DIAGNOSIS — M4322 Fusion of spine, cervical region: Secondary | ICD-10-CM | POA: Diagnosis not present

## 2020-03-04 DIAGNOSIS — M25512 Pain in left shoulder: Secondary | ICD-10-CM | POA: Diagnosis not present

## 2020-03-04 DIAGNOSIS — Z981 Arthrodesis status: Secondary | ICD-10-CM | POA: Diagnosis not present

## 2020-03-04 DIAGNOSIS — M4312 Spondylolisthesis, cervical region: Secondary | ICD-10-CM | POA: Diagnosis not present

## 2020-03-04 DIAGNOSIS — M542 Cervicalgia: Secondary | ICD-10-CM

## 2020-03-04 DIAGNOSIS — M5412 Radiculopathy, cervical region: Secondary | ICD-10-CM

## 2020-03-04 MED ORDER — HYDROCODONE-ACETAMINOPHEN 10-325 MG PO TABS: 1.0000 | ORAL_TABLET | Freq: Three times a day (TID) | ORAL | 0 refills | Status: DC | PRN

## 2020-03-04 MED ORDER — PREGABALIN 75 MG PO CAPS: 75.0000 mg | ORAL_CAPSULE | Freq: Two times a day (BID) | ORAL | 3 refills | Status: AC

## 2020-03-04 MED ORDER — CARIPRAZINE HCL 1.5 MG PO CAPS: 1.5000 mg | ORAL_CAPSULE | Freq: Every day | ORAL | 3 refills | Status: AC

## 2020-03-04 MED ORDER — METHOCARBAMOL 750 MG PO TABS: 750.0000 mg | ORAL_TABLET | Freq: Three times a day (TID) | ORAL | 0 refills | Status: AC | PRN

## 2020-03-04 MED ORDER — DEXAMETHASONE 4 MG PO TABS: 4.0000 mg | ORAL_TABLET | Freq: Three times a day (TID) | ORAL | 0 refills | Status: DC

## 2020-03-04 NOTE — Assessment & Plan Note (Signed)
Has historically done really well on Vraylar low-dose, currently only on Lamictal. We will not be prescribing her alprazolam, I am happy to restart Vraylar and she can continue this with her psychiatrist. She did tell me that her depressive symptoms are fairly severe at this point. She has had severe mania with SSRIs so we will be avoiding this.   Other atypical antipsychotics have resulted in excessive weight gain. She will follow up with her psychiatrist.

## 2020-03-04 NOTE — Assessment & Plan Note (Signed)
Bilateral without red flag symptoms. Adding x-rays, formal PT, Decadron as above, Lyrica. Return to see me in 4 to 6 weeks, MRI for interventional planning if no better.

## 2020-03-04 NOTE — Assessment & Plan Note (Signed)
This is a pleasant 39 year old female, she did have some radicular pain, progressive weakness so we obtained an MRI that showed significant T2 signal in her spinal cord, she was seen by Dr. Noel Gerold by her request, a multilevel fusion was performed. She initially did well but now is having a worsening of symptoms. No progressive weakness but severe pain in the neck and periscapular region, going to add 5 days of Decadron, hydrocodone, flexion/extension view cervical spine x-rays, formal physical therapy, Lyrica.

## 2020-03-04 NOTE — Progress Notes (Signed)
    Procedures performed today:    None.  Independent interpretation of notes and tests performed by another provider:   None.  Brief History, Exam, Impression, and Recommendations:    Cervical myelopathy (HCC) This is a pleasant 39 year old female, she did have some radicular pain, progressive weakness so we obtained an MRI that showed significant T2 signal in her spinal cord, she was seen by Dr. Noel Gerold by her request, a multilevel fusion was performed. She initially did well but now is having a worsening of symptoms. No progressive weakness but severe pain in the neck and periscapular region, going to add 5 days of Decadron, hydrocodone, flexion/extension view cervical spine x-rays, formal physical therapy, Lyrica.   Acute low back pain Bilateral without red flag symptoms. Adding x-rays, formal PT, Decadron as above, Lyrica. Return to see me in 4 to 6 weeks, MRI for interventional planning if no better.  Schizoaffective disorder, bipolar type Has historically done really well on Vraylar low-dose, currently only on Lamictal. We will not be prescribing her alprazolam, I am happy to restart Vraylar and she can continue this with her psychiatrist. She did tell me that her depressive symptoms are fairly severe at this point. She has had severe mania with SSRIs so we will be avoiding this.   Other atypical antipsychotics have resulted in excessive weight gain. She will follow up with her psychiatrist.    ___________________________________________ Ihor Austin. Benjamin Stain, M.D., ABFM., CAQSM. Primary Care and Sports Medicine [place] [facility]  Adjunct Instructor of Family Medicine  [employer]

## 2020-03-08 ENCOUNTER — Ambulatory Visit: Payer: BC Managed Care – PPO | Admitting: Rehabilitative and Restorative Service Providers"

## 2020-03-11 DIAGNOSIS — F4310 Post-traumatic stress disorder, unspecified: Secondary | ICD-10-CM | POA: Diagnosis not present

## 2020-03-11 DIAGNOSIS — F429 Obsessive-compulsive disorder, unspecified: Secondary | ICD-10-CM | POA: Diagnosis not present

## 2020-03-11 DIAGNOSIS — F319 Bipolar disorder, unspecified: Secondary | ICD-10-CM | POA: Diagnosis not present

## 2020-03-14 DIAGNOSIS — G959 Disease of spinal cord, unspecified: Secondary | ICD-10-CM | POA: Diagnosis not present

## 2020-03-14 DIAGNOSIS — M545 Low back pain, unspecified: Secondary | ICD-10-CM

## 2020-03-15 MED ORDER — HYDROCODONE-ACETAMINOPHEN 10-325 MG PO TABS: 1.0000 | ORAL_TABLET | Freq: Three times a day (TID) | ORAL | 0 refills | Status: DC | PRN

## 2020-03-15 NOTE — Telephone Encounter (Signed)
I spent 5 total minutes of online digital evaluation and management services. 

## 2020-03-21 ENCOUNTER — Telehealth: Payer: Self-pay | Admitting: Neurology

## 2020-03-21 NOTE — Telephone Encounter (Signed)
Prior Authorization for Hydrocodone-Acetaminophen submitted via covermymeds. Awaiting response. Your information has been submitted to [employer]. To check for an updated outcome later, reopen this PA request from your dashboard.  If [employer] has not responded to your request within 24 hours, contact [employer] at [phone]. If you think there may be a problem with your PA request, use our live chat feature at the bottom right.

## 2020-03-26 DIAGNOSIS — M47894 Other spondylosis, thoracic region: Secondary | ICD-10-CM | POA: Diagnosis not present

## 2020-03-26 DIAGNOSIS — M546 Pain in thoracic spine: Secondary | ICD-10-CM | POA: Diagnosis not present

## 2020-03-26 DIAGNOSIS — Z6824 Body mass index (BMI) 24.0-24.9, adult: Secondary | ICD-10-CM | POA: Diagnosis not present

## 2020-03-26 DIAGNOSIS — M545 Low back pain, unspecified: Secondary | ICD-10-CM | POA: Diagnosis not present

## 2020-03-26 DIAGNOSIS — M47896 Other spondylosis, lumbar region: Secondary | ICD-10-CM | POA: Diagnosis not present

## 2020-03-26 DIAGNOSIS — M4322 Fusion of spine, cervical region: Secondary | ICD-10-CM | POA: Diagnosis not present

## 2020-04-04 ENCOUNTER — Ambulatory Visit: Payer: BC Managed Care – PPO | Attending: Sports Medicine | Admitting: Physical Therapy

## 2020-04-08 ENCOUNTER — Telehealth: Payer: Self-pay | Admitting: Neurology

## 2020-04-08 NOTE — Telephone Encounter (Signed)
Prior Authorization for [employer] submitted via covermymeds. Awaiting response.

## 2020-04-24 ENCOUNTER — Emergency Department: Admit: 2020-04-24 | Discharge status: Absent without leave | Payer: Self-pay

## 2020-04-24 DIAGNOSIS — R3914 Feeling of incomplete bladder emptying: Secondary | ICD-10-CM | POA: Diagnosis not present

## 2020-04-24 DIAGNOSIS — N939 Abnormal uterine and vaginal bleeding, unspecified: Secondary | ICD-10-CM | POA: Diagnosis not present

## 2020-04-24 DIAGNOSIS — R1030 Lower abdominal pain, unspecified: Secondary | ICD-10-CM | POA: Diagnosis not present

## 2020-04-25 DIAGNOSIS — Z09 Encounter for follow-up examination after completed treatment for conditions other than malignant neoplasm: Secondary | ICD-10-CM | POA: Diagnosis not present

## 2020-04-25 DIAGNOSIS — N3001 Acute cystitis with hematuria: Secondary | ICD-10-CM | POA: Diagnosis not present

## 2020-08-31 IMAGING — MR MR CERVICAL SPINE W/O CM
5 series · 40 of 48 positions shown · non-contrast
Comparison: None.

CLINICAL DATA: Left C8 radiculitis. Symptoms for 2 years, worsening
since [REDACTED]. Cervical radiculopathy with no red flag symptoms,
interventional planning.

EXAM:
MRI CERVICAL SPINE WITHOUT CONTRAST
TECHNIQUE: Multiplanar, multisequence MR imaging of the cervical spine was
performed. No intravenous contrast was administered.

[Series 4: STIR · sagittal · 3.0mm · 0.69mm/px · 6 of 13 slices shown]
[im 1/13]
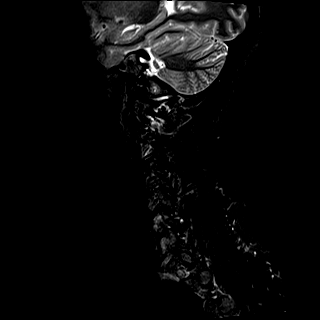
[im 3/13]
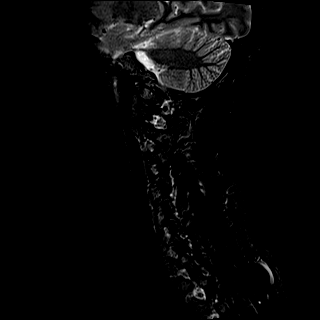
[im 5/13]
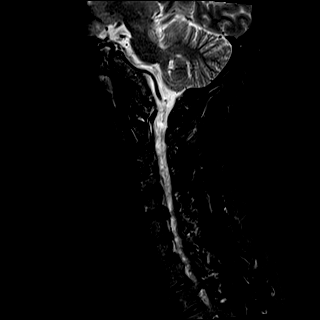
[im 8/13]
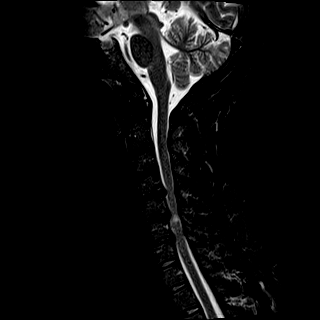
[im 10/13]
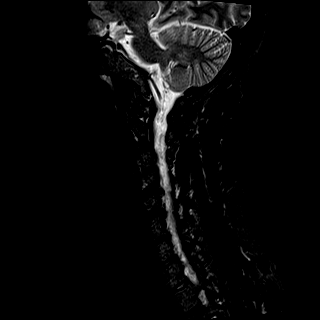
[im 13/13]
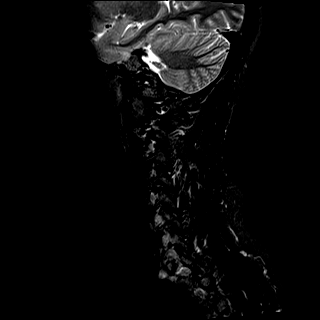

[Series 5: T1 · sagittal · 3.0mm · 0.86mm/px · 7 of 13 slices shown]
[im 1/13]
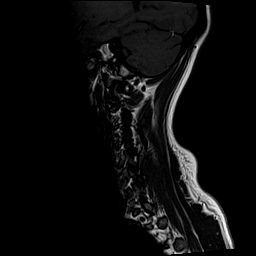
[im 3/13]
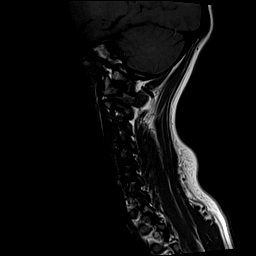
[im 5/13]
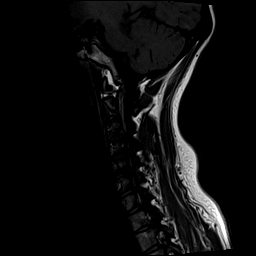
[im 7/13]
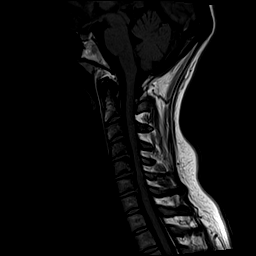
[im 9/13]
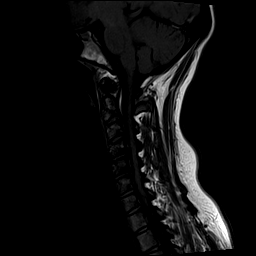
[im 11/13]
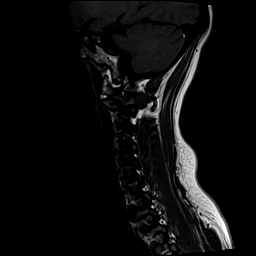
[im 13/13]
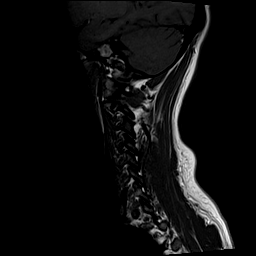

[Series 6: T2 · axial · 3.0mm · 0.62mm/px · z∈[-29,+67]mm · 12 of 27 slices shown (1 of 2)]
[im 1/27]
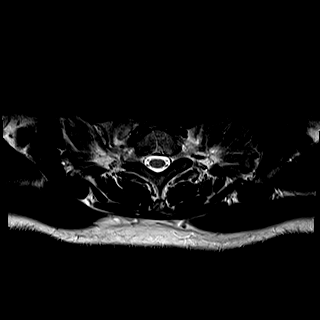
[im 3/27]
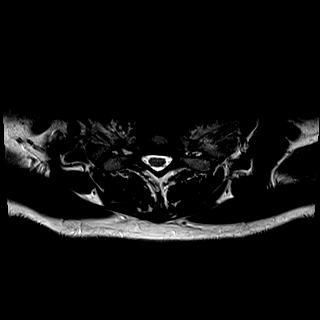
[im 5/27]
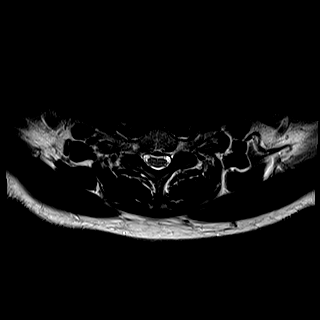
[im 7/27]
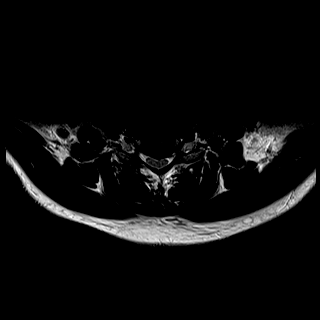
[im 9/27]
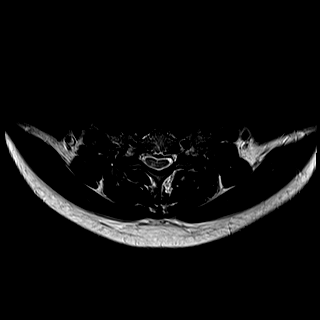
[im 11/27]
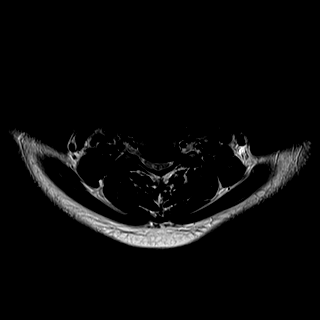
[im 13/27]
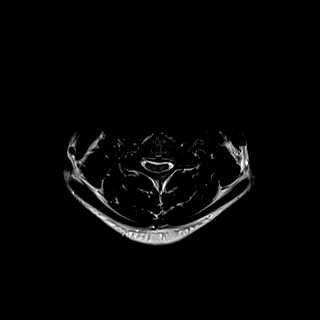
[im 15/27]
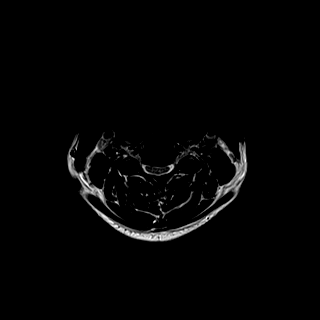
[im 17/27]
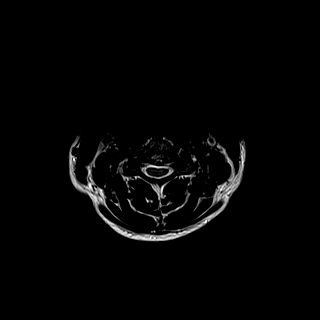
[im 19/27]
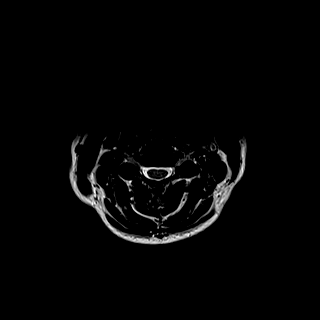
[im 23/27]
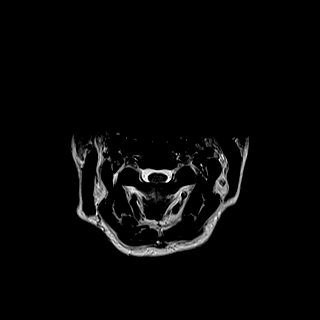
[im 27/27]
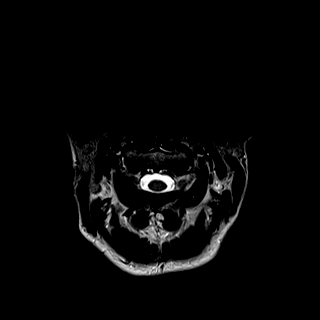

[Series 7: mpgr ax · axial · 3.0mm · 0.35mm/px · z∈[-19,+77]mm · 8 of 27 slices shown]
[im 1/27]
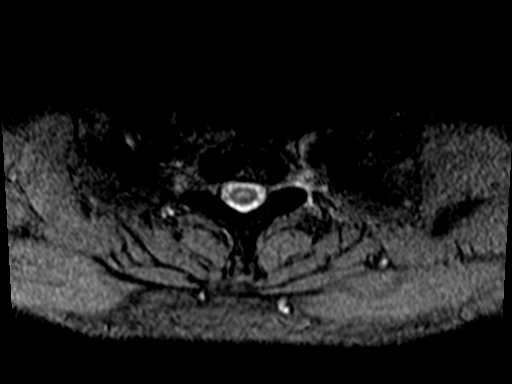
[im 5/27]
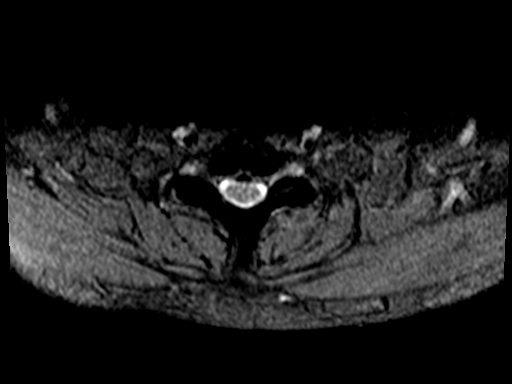
[im 9/27]
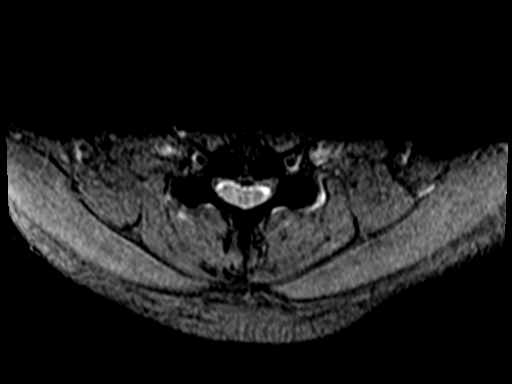
[im 13/27]
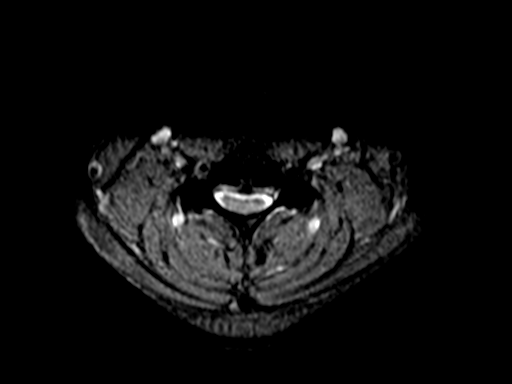
[im 15/27]
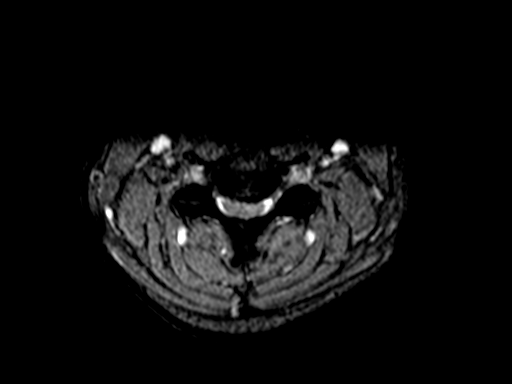
[im 19/27]
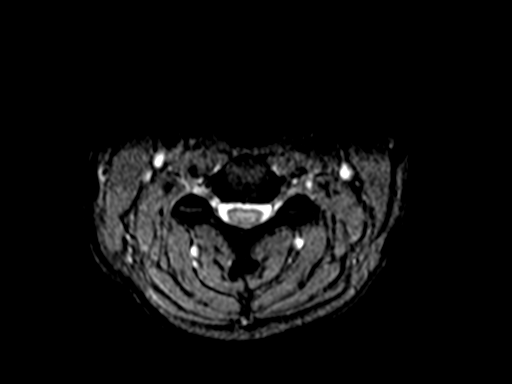
[im 23/27]
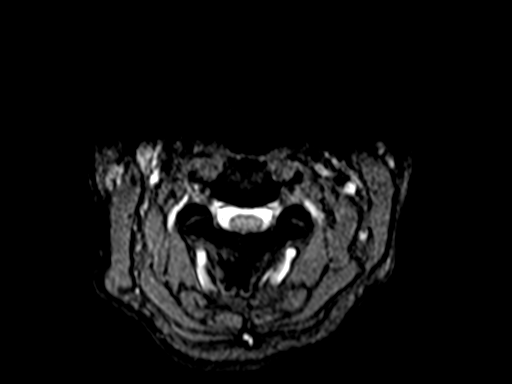
[im 27/27]
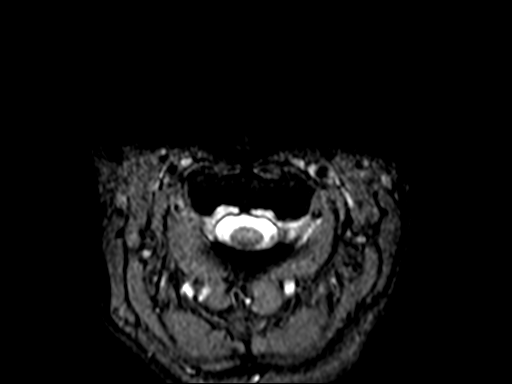

[Series 8: T2 · sagittal · 3.0mm · 0.69mm/px · 7 of 13 slices shown (2 of 2)]
[im 1/13]
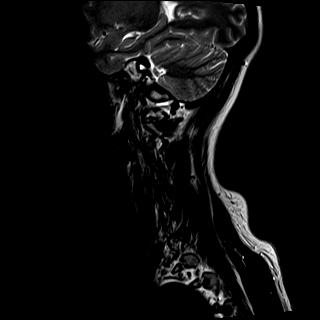
[im 3/13]
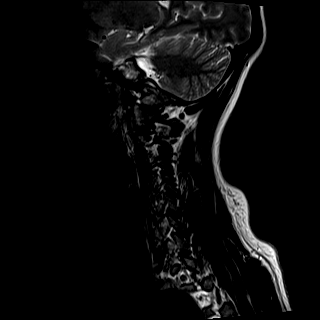
[im 5/13]
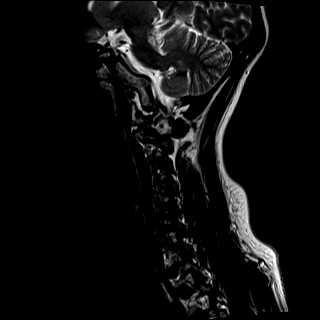
[im 7/13]
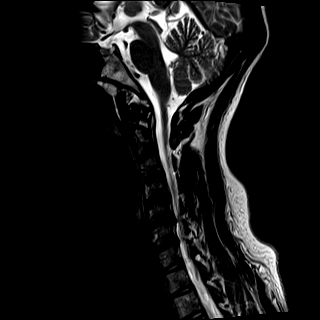
[im 9/13]
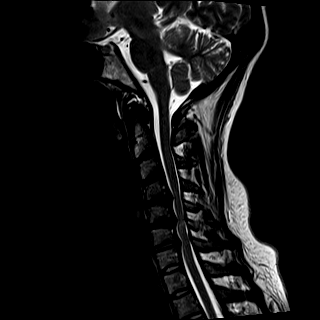
[im 11/13]
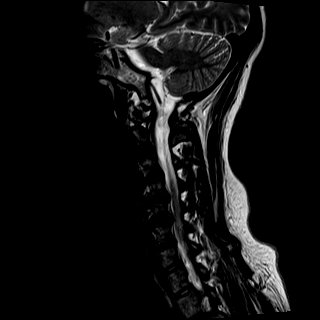
[im 13/13]
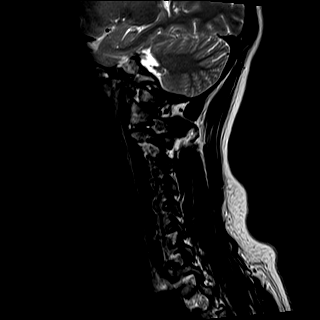

[40 of 48 positions shown; findings below may reference images not displayed]

FINDINGS: Alignment: Straightening of the cervical spine.

Vertebrae: No fracture, evidence of discitis, or bone lesion.

Cord: T2 hyperintensity in the cord at C5 where there is
degenerative deformity. The signal is more prominent on the right.
Milder T2 hyperintensity seen at the level of C4-5 cord impingement.
These T2 hyperintensities are not associated with swelling,
suggesting myelomalacia rather than edema. There is also a
longstanding history of symptoms.

Posterior Fossa, vertebral arteries, paraspinal tissues: Negative

Disc levels:

C2-3: Unremarkable.

C3-4: Unremarkable.

C4-5: Disc narrowing with a shallow central protrusion and endplate
ridging. Negative facets. There is spinal stenosis with cord
flattening and mild cord T2 hyperintensity. Uncovertebral ridging on
both sides with mild foraminal narrowing

C5-6: Disc narrowing and bulging with left eccentric disc
protrusion. There is endplate and uncovertebral ridging asymmetric
to the left. Left foraminal impingement is severe.

C6-7: Disc narrowing with a central protrusion flattening the cord.

C7-T1:Mild facet spurring.  No impingement

These results will be called to the ordering clinician or
representative by the Radiologist Assistant, and communication
documented in the PACS or [REDACTED].
IMPRESSION: 1. Degenerative, discogenic cord flattening at C4-5 to C6-7 with
cord signal abnormality at C5-6 and to a lesser extent at C4-5.
2. C5-6 advanced left foraminal impingement.

## 2020-09-09 ENCOUNTER — Encounter: Payer: Self-pay | Admitting: Emergency Medicine

## 2020-09-09 ENCOUNTER — Other Ambulatory Visit: Payer: Self-pay

## 2020-09-09 ENCOUNTER — Emergency Department (INDEPENDENT_AMBULATORY_CARE_PROVIDER_SITE_OTHER)
Admission: EM | Admit: 2020-09-09 | Discharge: 2020-09-09 | Disposition: A | Discharge status: Home / Self Care | Payer: BC Managed Care – PPO | Source: Home / Self Care

## 2020-09-09 DIAGNOSIS — N3001 Acute cystitis with hematuria: Secondary | ICD-10-CM | POA: Diagnosis not present

## 2020-09-09 DIAGNOSIS — R109 Unspecified abdominal pain: Secondary | ICD-10-CM

## 2020-09-09 DIAGNOSIS — R310 Gross hematuria: Secondary | ICD-10-CM

## 2020-09-09 DIAGNOSIS — F250 Schizoaffective disorder, bipolar type: Secondary | ICD-10-CM

## 2020-09-09 DIAGNOSIS — R300 Dysuria: Secondary | ICD-10-CM

## 2020-09-09 LAB — POCT URINALYSIS DIP (MANUAL ENTRY)
Bilirubin, UA: NEGATIVE
Glucose, UA: NEGATIVE mg/dL
Ketones, POC UA: NEGATIVE mg/dL
Nitrite, UA: POSITIVE — AB
Spec Grav, UA: 1.01 (ref 1.010–1.025)
Urobilinogen, UA: 0.2 E.U./dL
pH, UA: 6 (ref 5.0–8.0)

## 2020-09-09 MED ORDER — CEPHALEXIN 500 MG PO CAPS
500.0000 mg | ORAL_CAPSULE | Freq: Two times a day (BID) | ORAL | 0 refills | Status: AC
Start: 2020-09-09 — End: 2020-09-16

## 2020-09-09 NOTE — Discharge Instructions (Addendum)
I have sent in Keflex for you to take twice a day for 7 days  We are going to culture your urine and will call you as soon as we have the results.   Drink plenty of water, 8-10 glasses per day.   You may take AZO over the counter for painful urination.  Follow up with your primary care provider as needed.   Go to the Emergency Department if you experience severe pain, shortness of breath, high fever, or other concerns.

## 2020-09-09 NOTE — ED Provider Notes (Signed)
[facility]   CC: UTI  SUBJECTIVE:  Brenda Perry is a 40 y.o. female who complains of urinary frequency, urgency and dysuria for the past week. Reports right flank pain as well. Patient denies a precipitating event, recent sexual encounter, excessive caffeine intake. Localizes the pain to the lower abdomen and right flank. Pain is intermittent and describes it as sharp. Has not tried OTC medications without relief.  Symptoms are made worse with urination. Admits to similar symptoms in the past. Denies fever, chills, nausea, vomiting, abnormal vaginal discharge or bleeding, hematuria.    LMP: Patient's last menstrual period was 09/12/2006.  ROS: As in HPI.  All other pertinent ROS negative.     Past Medical History:  Diagnosis Date   Bipolar 1 disorder (HCC)    Bipolar disorder (HCC)    Depression    Endometriosis 2011   Erosion of vaginal mesh (HCC)    H/O abnormal Pap smear    cryo   HTN (hypertension)    Hyperlipidemia    Hypertension    IBS (irritable bowel syndrome)    Obesity    OCD (obsessive compulsive disorder)    Ovarian cyst 2011   PCOS (polycystic ovarian syndrome)    Pneumonia 2010   Polycystic ovary syndrome    PSVT (paroxysmal supraventricular tachycardia) (HCC)    Past Surgical History:  Procedure Laterality Date   ABDOMINAL HYSTERECTOMY     ADENOIDECTOMY     breast tissure bx-fibrosis     INCONTINENCE SURGERY     LAPAROSCOPY  1996  2011   lavh  2008   TONSILLECTOMY     `   TONSILLECTOMY AND ADENOIDECTOMY  1996   vaginal/rectal repair  2008   Allergies  Allergen Reactions   Eszopiclone     Other reaction(s): Hallucinations   Poison Ivy Extract Anaphylaxis and Rash   Duloxetine Other (See Comments)    unknown unknown    Pregabalin Other (See Comments)    mood    Topiramate Other (See Comments)    Other reaction(s): Hallucinations   Varenicline Other (See Comments)    mood    Vortioxetine Other (See Comments)    Moods    Ativan [Lorazepam]     States not allergic   Cariprazine Other (See Comments)   Corticosteroids    Cymbalta [Duloxetine Hcl]    Depakote [Divalproex Sodium]    Effexor [Venlafaxine Hcl]    Geodon [Ziprasidone]    Lamictal [Lamotrigine]    Latex    Lithium    Neurontin [Gabapentin]    Other Other (See Comments)    Patient states there are numerous erroneous medication allergies in this list; cannot cancel them; see triage note for corrected med allergies.   Paxil [Paroxetine Hcl]    Prozac [Fluoxetine]    Quetiapine Other (See Comments)    unknown   Risperdal [Risperidone]    Seroquel [Quetiapine Fumarate]    Xanax [Alprazolam]     Not allergic   Ziprasidone Hcl Other (See Comments)    unknown   Zoloft [Sertraline Hcl]    Latex Rash   No current facility-administered medications on file prior to encounter.   Current Outpatient Medications on File Prior to Encounter  Medication Sig Dispense Refill   ALPRAZolam (XANAX) 1 MG tablet Take 1 mg by mouth at bedtime as needed for anxiety.     amphetamine-dextroamphetamine (ADDERALL XR) 20 MG 24 hr capsule Take by mouth.     cariprazine (VRAYLAR) capsule Take 1 capsule (1.5  mg total) by mouth daily. 30 capsule 3   hydroxychloroquine (PLAQUENIL) 200 MG tablet Take by mouth daily.     metFORMIN (GLUCOPHAGE) 1000 MG tablet Take 1,000 mg by mouth 2 (two) times daily.     methocarbamol (ROBAXIN) 750 MG tablet Take 1 tablet (750 mg total) by mouth every 8 (eight) hours as needed for muscle spasms. 90 tablet 0   nebivolol (BYSTOLIC) 10 MG tablet Take by mouth.     pantoprazole (PROTONIX) 40 MG tablet Take 40 mg by mouth every morning.     pregabalin (LYRICA) 75 MG capsule Take 1 capsule (75 mg total) by mouth 2 (two) times daily. 60 capsule 3   Social History   Socioeconomic History   Marital status: Married    Spouse name: Not on file   Number of children: 3   Years of education: Not on file   Highest education level: Not on file   Occupational History   Not on file  Tobacco Use   Smoking status: Every Day    Packs/day: 1.00    Years: 15.00    Pack years: 15.00    Types: Cigarettes   Smokeless tobacco: Never  Vaping Use   Vaping Use: Never used  Substance and Sexual Activity   Alcohol use: Yes    Comment: jan 2013   Drug use: No    Comment: Past use 21.   Sexual activity: Yes    Partners: Male    Birth control/protection: Other-see comments    Comment: hysterectomy  Other Topics Concern   Not on file  Social History Narrative   ** Merged History Encounter **       Social Determinants of Health   Financial Resource Strain: Not on file  Food Insecurity: Not on file  Transportation Needs: Not on file  Physical Activity: Not on file  Stress: Not on file  Social Connections: Not on file  Intimate Partner Violence: Not on file   Family History  Problem Relation Age of Onset   Diabetes Other    Hypertension Other    Cancer Other    Vasculitis Other    Fibromyalgia Other    Osteoporosis Other    Heart disease Other    Cancer Father        femure   Hypertension Father    Hyperlipidemia Father    Diabetes Father    Vasculitis Father        Cerbral Vasculitis   Cancer Mother        uterine   Hyperlipidemia Mother    Fibromyalgia Mother    Cancer Paternal Aunt        vaginal   Endometriosis Cousin    Lupus Cousin    Heart disease Paternal Grandmother    Diabetes Paternal Grandmother    Thyroid disease Paternal Grandmother    Heart disease Paternal Aunt    Heart disease Maternal Grandfather        A-Fib   Hyperlipidemia Maternal Grandfather    Hyperlipidemia Maternal Grandmother    Coronary artery disease Paternal Grandfather    Aneurysm Paternal Uncle     OBJECTIVE:  Vitals:   09/09/20 1512 09/09/20 1513  BP: (!) 144/89   Pulse: 77   Resp: 18   Temp: 98.8 F (37.1 C)   TempSrc: Oral   SpO2: 98%   Weight:  162 lb (73.5 kg)  Height:  5\' 5"  (1.651 m)   General appearance:  AOx3 in no acute distress HEENT: NCAT.  Oropharynx clear.  Lungs: clear to auscultation bilaterally without adventitious breath sounds Heart: regular rate and rhythm. Radial pulses 2+ symmetrical bilaterally Abdomen: soft; non-distended; suprapubic tenderness; bowel sounds present; no guarding or rebound tenderness Back: right CVA tenderness Extremities: no edema; symmetrical with no gross deformities Skin: warm and dry Neurologic: Ambulates from chair to exam table without difficulty Psychological: alert and cooperative; normal mood and affect  Labs Reviewed  POCT URINALYSIS DIP (MANUAL ENTRY) - Abnormal; Notable for the following components:      Result Value   Clarity, UA cloudy (*)    Blood, UA large (*)    Protein Ur, POC trace (*)    Nitrite, UA Positive (*)    Leukocytes, UA Moderate (2+) (*)    All other components within normal limits  URINE CULTURE    ASSESSMENT & PLAN:  1. Acute cystitis with hematuria   2. Gross hematuria   3. Dysuria   4. Right flank pain   5. Schizoaffective disorder, bipolar type (HCC)     Meds ordered this encounter  Medications   cephALEXin (KEFLEX) 500 MG capsule    Sig: Take 1 capsule (500 mg total) by mouth 2 (two) times daily for 7 days.    Dispense:  14 capsule    Refill:  0    Order Specific Question:   Supervising Provider    Answer:   Merrilee Jansky [[id number]]   UA concerning for infection today Prescribed Keflex 500mg  BID x 7 days Urine culture sent  We will call you with abnormal results that need further treatment Push fluids and get plenty of rest Take antibiotic as directed and to completion Take pyridium as needed for symptomatic relief Follow up with PCP if symptoms persists Return here or go to ER if you have any new or worsening symptoms such as fever, worsening abdominal pain, nausea/vomiting, flank pain  Outlined signs and symptoms indicating need for more acute intervention Patient verbalized  understanding After Visit Summary given      , NP 09/09/20 1540

## 2020-09-09 NOTE — ED Triage Notes (Signed)
Hematuria, polyuria x 6 days Unvaccinated

## 2020-09-12 LAB — URINE CULTURE
MICRO NUMBER:: 12003043
SPECIMEN QUALITY:: ADEQUATE

## 2021-09-05 ENCOUNTER — Other Ambulatory Visit: Payer: BC Managed Care – PPO
